# Patient Record
Sex: Male | Born: 1965 | Race: White | Hispanic: No | Marital: Married | Smoking: Never smoker
Health system: Southern US, Community
[De-identification: ages and names within clinical notes are randomized; demographics above are authoritative.]

## PROBLEM LIST (undated history)

## (undated) DIAGNOSIS — R002 Palpitations: Secondary | ICD-10-CM

## (undated) DIAGNOSIS — L4050 Arthropathic psoriasis, unspecified: Secondary | ICD-10-CM

## (undated) DIAGNOSIS — I10 Essential (primary) hypertension: Secondary | ICD-10-CM

## (undated) DIAGNOSIS — J309 Allergic rhinitis, unspecified: Secondary | ICD-10-CM

## (undated) DIAGNOSIS — E785 Hyperlipidemia, unspecified: Secondary | ICD-10-CM

## (undated) HISTORY — DX: Essential (primary) hypertension: I10

## (undated) HISTORY — DX: Hyperlipidemia, unspecified: E78.5

## (undated) HISTORY — DX: Palpitations: R00.2

## (undated) HISTORY — PX: OTHER SURGICAL HISTORY: SHX169

## (undated) HISTORY — DX: Allergic rhinitis, unspecified: J30.9

## (undated) HISTORY — DX: Arthropathic psoriasis, unspecified: L40.50

---

## 2016-07-29 DIAGNOSIS — M79672 Pain in left foot: Secondary | ICD-10-CM | POA: Diagnosis not present

## 2016-07-29 DIAGNOSIS — Z79899 Other long term (current) drug therapy: Secondary | ICD-10-CM | POA: Diagnosis not present

## 2016-07-29 DIAGNOSIS — M79671 Pain in right foot: Secondary | ICD-10-CM | POA: Diagnosis not present

## 2016-07-29 DIAGNOSIS — L4050 Arthropathic psoriasis, unspecified: Secondary | ICD-10-CM | POA: Diagnosis not present

## 2016-08-01 DIAGNOSIS — M79671 Pain in right foot: Secondary | ICD-10-CM | POA: Diagnosis not present

## 2016-08-01 DIAGNOSIS — L4050 Arthropathic psoriasis, unspecified: Secondary | ICD-10-CM | POA: Diagnosis not present

## 2016-08-01 DIAGNOSIS — Z79899 Other long term (current) drug therapy: Secondary | ICD-10-CM | POA: Diagnosis not present

## 2016-08-01 DIAGNOSIS — M79672 Pain in left foot: Secondary | ICD-10-CM | POA: Diagnosis not present

## 2016-08-25 DIAGNOSIS — L4050 Arthropathic psoriasis, unspecified: Secondary | ICD-10-CM | POA: Diagnosis not present

## 2016-08-25 DIAGNOSIS — M79672 Pain in left foot: Secondary | ICD-10-CM | POA: Diagnosis not present

## 2016-08-25 DIAGNOSIS — Z79899 Other long term (current) drug therapy: Secondary | ICD-10-CM | POA: Diagnosis not present

## 2016-08-25 DIAGNOSIS — M79671 Pain in right foot: Secondary | ICD-10-CM | POA: Diagnosis not present

## 2016-09-17 DIAGNOSIS — M79671 Pain in right foot: Secondary | ICD-10-CM | POA: Diagnosis not present

## 2016-09-17 DIAGNOSIS — Z79899 Other long term (current) drug therapy: Secondary | ICD-10-CM | POA: Diagnosis not present

## 2016-09-17 DIAGNOSIS — L4050 Arthropathic psoriasis, unspecified: Secondary | ICD-10-CM | POA: Diagnosis not present

## 2016-09-17 DIAGNOSIS — M79672 Pain in left foot: Secondary | ICD-10-CM | POA: Diagnosis not present

## 2016-12-16 DIAGNOSIS — M79672 Pain in left foot: Secondary | ICD-10-CM | POA: Diagnosis not present

## 2016-12-16 DIAGNOSIS — M79674 Pain in right toe(s): Secondary | ICD-10-CM | POA: Diagnosis not present

## 2016-12-16 DIAGNOSIS — Z79899 Other long term (current) drug therapy: Secondary | ICD-10-CM | POA: Diagnosis not present

## 2016-12-16 DIAGNOSIS — L4050 Arthropathic psoriasis, unspecified: Secondary | ICD-10-CM | POA: Diagnosis not present

## 2016-12-16 DIAGNOSIS — M79671 Pain in right foot: Secondary | ICD-10-CM | POA: Diagnosis not present

## 2016-12-31 DIAGNOSIS — L409 Psoriasis, unspecified: Secondary | ICD-10-CM | POA: Diagnosis not present

## 2016-12-31 DIAGNOSIS — L570 Actinic keratosis: Secondary | ICD-10-CM | POA: Diagnosis not present

## 2016-12-31 DIAGNOSIS — L578 Other skin changes due to chronic exposure to nonionizing radiation: Secondary | ICD-10-CM | POA: Diagnosis not present

## 2016-12-31 DIAGNOSIS — L814 Other melanin hyperpigmentation: Secondary | ICD-10-CM | POA: Diagnosis not present

## 2016-12-31 DIAGNOSIS — L821 Other seborrheic keratosis: Secondary | ICD-10-CM | POA: Diagnosis not present

## 2017-02-20 DIAGNOSIS — L820 Inflamed seborrheic keratosis: Secondary | ICD-10-CM | POA: Diagnosis not present

## 2017-05-14 DIAGNOSIS — Z79899 Other long term (current) drug therapy: Secondary | ICD-10-CM | POA: Diagnosis not present

## 2017-05-14 DIAGNOSIS — L4050 Arthropathic psoriasis, unspecified: Secondary | ICD-10-CM | POA: Diagnosis not present

## 2017-05-14 DIAGNOSIS — L409 Psoriasis, unspecified: Secondary | ICD-10-CM | POA: Diagnosis not present

## 2017-06-15 DIAGNOSIS — L409 Psoriasis, unspecified: Secondary | ICD-10-CM | POA: Diagnosis not present

## 2017-06-15 DIAGNOSIS — Z79899 Other long term (current) drug therapy: Secondary | ICD-10-CM | POA: Diagnosis not present

## 2017-06-15 DIAGNOSIS — M7989 Other specified soft tissue disorders: Secondary | ICD-10-CM | POA: Diagnosis not present

## 2017-06-15 DIAGNOSIS — L4050 Arthropathic psoriasis, unspecified: Secondary | ICD-10-CM | POA: Diagnosis not present

## 2017-08-10 DIAGNOSIS — M7989 Other specified soft tissue disorders: Secondary | ICD-10-CM | POA: Diagnosis not present

## 2017-08-10 DIAGNOSIS — L4050 Arthropathic psoriasis, unspecified: Secondary | ICD-10-CM | POA: Diagnosis not present

## 2017-08-10 DIAGNOSIS — Z79899 Other long term (current) drug therapy: Secondary | ICD-10-CM | POA: Diagnosis not present

## 2017-08-10 DIAGNOSIS — L409 Psoriasis, unspecified: Secondary | ICD-10-CM | POA: Diagnosis not present

## 2017-09-30 DIAGNOSIS — M7989 Other specified soft tissue disorders: Secondary | ICD-10-CM | POA: Diagnosis not present

## 2017-09-30 DIAGNOSIS — L4050 Arthropathic psoriasis, unspecified: Secondary | ICD-10-CM | POA: Diagnosis not present

## 2017-09-30 DIAGNOSIS — L409 Psoriasis, unspecified: Secondary | ICD-10-CM | POA: Diagnosis not present

## 2017-09-30 DIAGNOSIS — Z79899 Other long term (current) drug therapy: Secondary | ICD-10-CM | POA: Diagnosis not present

## 2017-10-05 DIAGNOSIS — Z79899 Other long term (current) drug therapy: Secondary | ICD-10-CM | POA: Diagnosis not present

## 2017-10-05 DIAGNOSIS — L4050 Arthropathic psoriasis, unspecified: Secondary | ICD-10-CM | POA: Diagnosis not present

## 2017-10-12 DIAGNOSIS — Z79899 Other long term (current) drug therapy: Secondary | ICD-10-CM | POA: Diagnosis not present

## 2017-10-12 DIAGNOSIS — M79675 Pain in left toe(s): Secondary | ICD-10-CM | POA: Diagnosis not present

## 2017-10-12 DIAGNOSIS — L4050 Arthropathic psoriasis, unspecified: Secondary | ICD-10-CM | POA: Diagnosis not present

## 2017-10-12 DIAGNOSIS — L409 Psoriasis, unspecified: Secondary | ICD-10-CM | POA: Diagnosis not present

## 2017-10-12 DIAGNOSIS — M7989 Other specified soft tissue disorders: Secondary | ICD-10-CM | POA: Diagnosis not present

## 2017-11-02 DIAGNOSIS — Z125 Encounter for screening for malignant neoplasm of prostate: Secondary | ICD-10-CM | POA: Diagnosis not present

## 2017-11-02 DIAGNOSIS — Z136 Encounter for screening for cardiovascular disorders: Secondary | ICD-10-CM | POA: Diagnosis not present

## 2017-11-02 DIAGNOSIS — Z0000 Encounter for general adult medical examination without abnormal findings: Secondary | ICD-10-CM | POA: Diagnosis not present

## 2017-12-30 DIAGNOSIS — L4050 Arthropathic psoriasis, unspecified: Secondary | ICD-10-CM | POA: Diagnosis not present

## 2017-12-30 DIAGNOSIS — M549 Dorsalgia, unspecified: Secondary | ICD-10-CM | POA: Diagnosis not present

## 2017-12-30 DIAGNOSIS — M5136 Other intervertebral disc degeneration, lumbar region: Secondary | ICD-10-CM | POA: Diagnosis not present

## 2017-12-30 DIAGNOSIS — M545 Low back pain: Secondary | ICD-10-CM | POA: Diagnosis not present

## 2017-12-30 DIAGNOSIS — Z79899 Other long term (current) drug therapy: Secondary | ICD-10-CM | POA: Diagnosis not present

## 2017-12-30 DIAGNOSIS — M7989 Other specified soft tissue disorders: Secondary | ICD-10-CM | POA: Diagnosis not present

## 2018-01-08 DIAGNOSIS — M5441 Lumbago with sciatica, right side: Secondary | ICD-10-CM | POA: Diagnosis not present

## 2018-01-08 DIAGNOSIS — Z6828 Body mass index (BMI) 28.0-28.9, adult: Secondary | ICD-10-CM | POA: Diagnosis not present

## 2018-01-08 DIAGNOSIS — R030 Elevated blood-pressure reading, without diagnosis of hypertension: Secondary | ICD-10-CM | POA: Diagnosis not present

## 2018-01-19 ENCOUNTER — Ambulatory Visit: Payer: BLUE CROSS/BLUE SHIELD | Attending: Neurosurgery | Admitting: Physical Therapy

## 2018-01-19 ENCOUNTER — Encounter: Payer: Self-pay | Admitting: Physical Therapy

## 2018-01-19 DIAGNOSIS — M5441 Lumbago with sciatica, right side: Secondary | ICD-10-CM | POA: Insufficient documentation

## 2018-01-19 DIAGNOSIS — M6281 Muscle weakness (generalized): Secondary | ICD-10-CM | POA: Insufficient documentation

## 2018-01-19 NOTE — Therapy (Addendum)
[facility] [place], [place], [address] Phone: [phone]   Fax:  [phone]  Physical Therapy Evaluation/Discharge  Patient Details  Name: Kevin Walsh MRN: [id number] Date of Birth: 1966-02-06 Referring Provider (PT): Frederich Cha, MD   Encounter Date: 01/19/2018  PT End of Session - 01/19/18 1602    Visit Number  1    Number of Visits  1    Date for PT Re-Evaluation  01/26/18    PT Start Time  4008    PT Stop Time  1600    PT Time Calculation (min)  45 min    Activity Tolerance  Patient tolerated treatment well    Behavior During Therapy  [facility] for tasks assessed/performed       Past Medical History:  Diagnosis Date  . Allergic rhinitis, cause unspecified   . Palpitations     History reviewed. No pertinent surgical history.  There were no vitals filed for this visit.   Subjective Assessment - 01/19/18 2200    Subjective  Patient arrives to physical therapy with reports of acute low back pain after trimming the hedges and playing golf. Patient reported discomfort while reaching forward trimming a hedge and after a  golf swing but continued to play. Patient stated the following day he began to feel numbness, tingling, and burning sensation from low back down right mid thigh. Patient reports he was prescribed anti-inflammatories and muscle relaxers that helped with pain and neurological symptoms but still reported muscle weakness in R leg. Patient's pain at worst is 2/10; pain at best is 0/10. Patient's goals are to return to PLOF and return to playing golf with no pain    Limitations  Standing;Walking    Patient Stated Goals  x-Ray    Currently in Pain?  Yes    Pain Score  2     Pain Location  Back    Pain Orientation  Lower;Right    Pain Descriptors / Indicators  Aching;Sharp    Pain Type  Acute pain    Pain Onset  1 to 4 weeks ago    Aggravating Factors   n/a    Pain Relieving Factors  exercise          [facility] PT Assessment - 01/19/18 0001      Assessment   Medical Diagnosis  Acute low back pain with right sided scatica    Referring Provider (PT)  Frederich Cha, MD    Onset Date/Surgical Date  12/26/17    Next MD Visit  December, 2019    Prior Therapy  no      Precautions   Precautions  None      Restrictions   Weight Bearing Restrictions  No      Balance Screen   Has the patient fallen in the past 6 months  No    Has the patient had a decrease in activity level because of a fear of falling?   No    Is the patient reluctant to leave their home because of a fear of falling?   No      Home Film/video editor residence      Prior Function   Level of Independence  Independent      ROM / Strength   AROM / PROM / Strength  Strength      Strength   Strength Assessment Site  Hip;Knee    Right/Left Hip  Right;Left    Right Hip  Flexion  4-/5    Right Hip Extension  3/5    Right Hip ABduction  4-/5    Left Hip Flexion  4+/5    Left Hip Extension  4/5    Left Hip ABduction  4-/5    Left Hip ADduction  4/5    Right/Left Knee  Right;Left    Right Knee Flexion  4/5    Right Knee Extension  4/5    Left Knee Flexion  4+/5    Left Knee Extension  4+/5      Palpation   SI assessment   unequal leg lengths R>L; equalized after SIJ LEg length MET    Palpation comment  minimal tenderness to palpation to lumbar paraspinals.      Transfers   Transfers  Independent with all Transfers      Ambulation/Gait   Gait Pattern  Within Functional Limits                Objective measurements completed on examination: See above findings.              PT Education - 01/19/18 2211    Education Details  draw ins, SLR, bridges, piriformis stretch, LTR stretch, SIJ MET, sit to stand, scatic nerve glide, lateral step ups    Methods  Explanation;Demonstration;Handout    Comprehension  Verbalized understanding;Returned demonstration           PT Long Term Goals - 01/19/18 2214      PT LONG TERM GOAL #1   Title  Patient will be independent with HEP    Time  1    Period  Days    Status  New             Plan - 01/19/18 2215    Clinical Impression Statement  Patient is a 52 year old male who presents to physical therapy with right LE weakness. Patient noted with minimal tenderness to palpation along R SIJ and right QL. Patient noted with unequal leg lengths, R>L. Per patient request, evaluation only performed. Patient was provided with HEP and was reviewed consisting of core stabilization exercises as well as LE strengthening. Patient was able to demonstrate good form with all exercises. Patient educated importance of core stabilization for low back pain as well as provided with basic anatomy of spine and sacroiliac joint. Patient instructed to call if any questions arise. Patient reported understanding. Patient to be discharged today.    Clinical Presentation  Stable    Clinical Decision Making  Low    Rehab Potential  Excellent    PT Frequency  One time visit    PT Treatment/Interventions  Patient/family education;ADLs/Self Care Home Management;Therapeutic exercise    PT Next Visit Plan  EVAL ONLY    PT Home Exercise Plan  see patient education section    Consulted and Agree with Plan of Care  Patient       Patient will benefit from skilled therapeutic intervention in order to improve the following deficits and impairments:  Pain, Postural dysfunction, Decreased strength, Decreased range of motion  Visit Diagnosis: Acute right-sided low back pain with right-sided sciatica - Plan: PT plan of care cert/re-cert  Muscle weakness (generalized) - Plan: PT plan of care cert/re-cert     Problem List There are no active problems to display for this patient.   PHYSICAL THERAPY DISCHARGE SUMMARY  Visits from Start of Care: 1   Current functional level related to goals / functional outcomes: Evaluation  only  Remaining deficits: Goal met   Education / Equipment: HEP  Plan: Patient agrees to discharge.  Patient goals were not met. Patient is being discharged due to the patient's request.  ?????      Gabriela Eves, PT, DPT 01/19/2018, 10:18 PM  [facility] [place], [place], [address] Phone: [phone]   Fax:  [phone]  Name: MEDARD DECUIR MRN: [id number] Date of Birth: February 27, 1966

## 2018-02-11 DIAGNOSIS — Z1211 Encounter for screening for malignant neoplasm of colon: Secondary | ICD-10-CM | POA: Diagnosis not present

## 2018-02-11 DIAGNOSIS — K640 First degree hemorrhoids: Secondary | ICD-10-CM | POA: Diagnosis not present

## 2018-02-25 DIAGNOSIS — L409 Psoriasis, unspecified: Secondary | ICD-10-CM | POA: Diagnosis not present

## 2018-02-25 DIAGNOSIS — I781 Nevus, non-neoplastic: Secondary | ICD-10-CM | POA: Diagnosis not present

## 2018-02-25 DIAGNOSIS — L820 Inflamed seborrheic keratosis: Secondary | ICD-10-CM | POA: Diagnosis not present

## 2018-02-25 DIAGNOSIS — L821 Other seborrheic keratosis: Secondary | ICD-10-CM | POA: Diagnosis not present

## 2018-02-25 DIAGNOSIS — L570 Actinic keratosis: Secondary | ICD-10-CM | POA: Diagnosis not present

## 2018-02-25 DIAGNOSIS — L4050 Arthropathic psoriasis, unspecified: Secondary | ICD-10-CM | POA: Diagnosis not present

## 2018-02-25 DIAGNOSIS — D1801 Hemangioma of skin and subcutaneous tissue: Secondary | ICD-10-CM | POA: Diagnosis not present

## 2018-03-10 DIAGNOSIS — I10 Essential (primary) hypertension: Secondary | ICD-10-CM | POA: Diagnosis not present

## 2018-03-26 ENCOUNTER — Other Ambulatory Visit: Payer: Self-pay | Admitting: Geriatric Medicine

## 2018-03-26 ENCOUNTER — Ambulatory Visit
Admission: RE | Admit: 2018-03-26 | Discharge: 2018-03-26 | Disposition: A | Discharge status: Home / Self Care | Payer: BLUE CROSS/BLUE SHIELD | Source: Ambulatory Visit | Attending: Geriatric Medicine | Admitting: Geriatric Medicine

## 2018-03-26 DIAGNOSIS — R05 Cough: Secondary | ICD-10-CM

## 2018-03-26 DIAGNOSIS — I10 Essential (primary) hypertension: Secondary | ICD-10-CM | POA: Diagnosis not present

## 2018-03-26 DIAGNOSIS — R509 Fever, unspecified: Secondary | ICD-10-CM | POA: Diagnosis not present

## 2018-03-26 DIAGNOSIS — R059 Cough, unspecified: Secondary | ICD-10-CM

## 2018-03-26 DIAGNOSIS — Z79899 Other long term (current) drug therapy: Secondary | ICD-10-CM | POA: Diagnosis not present

## 2018-04-05 DIAGNOSIS — M7989 Other specified soft tissue disorders: Secondary | ICD-10-CM | POA: Diagnosis not present

## 2018-04-05 DIAGNOSIS — Z79899 Other long term (current) drug therapy: Secondary | ICD-10-CM | POA: Diagnosis not present

## 2018-04-05 DIAGNOSIS — M549 Dorsalgia, unspecified: Secondary | ICD-10-CM | POA: Diagnosis not present

## 2018-04-05 DIAGNOSIS — L4050 Arthropathic psoriasis, unspecified: Secondary | ICD-10-CM | POA: Diagnosis not present

## 2018-04-05 DIAGNOSIS — M79676 Pain in unspecified toe(s): Secondary | ICD-10-CM | POA: Diagnosis not present

## 2018-04-07 DIAGNOSIS — I10 Essential (primary) hypertension: Secondary | ICD-10-CM | POA: Diagnosis not present

## 2018-09-20 DIAGNOSIS — M549 Dorsalgia, unspecified: Secondary | ICD-10-CM | POA: Diagnosis not present

## 2018-09-20 DIAGNOSIS — L4050 Arthropathic psoriasis, unspecified: Secondary | ICD-10-CM | POA: Diagnosis not present

## 2018-09-20 DIAGNOSIS — Z791 Long term (current) use of non-steroidal anti-inflammatories (NSAID): Secondary | ICD-10-CM | POA: Diagnosis not present

## 2018-09-20 DIAGNOSIS — Z79899 Other long term (current) drug therapy: Secondary | ICD-10-CM | POA: Diagnosis not present

## 2018-11-22 DIAGNOSIS — L814 Other melanin hyperpigmentation: Secondary | ICD-10-CM | POA: Diagnosis not present

## 2018-11-22 DIAGNOSIS — L578 Other skin changes due to chronic exposure to nonionizing radiation: Secondary | ICD-10-CM | POA: Diagnosis not present

## 2018-11-22 DIAGNOSIS — L821 Other seborrheic keratosis: Secondary | ICD-10-CM | POA: Diagnosis not present

## 2018-11-22 DIAGNOSIS — L409 Psoriasis, unspecified: Secondary | ICD-10-CM | POA: Diagnosis not present

## 2018-11-22 DIAGNOSIS — L570 Actinic keratosis: Secondary | ICD-10-CM | POA: Diagnosis not present

## 2018-12-22 DIAGNOSIS — Z791 Long term (current) use of non-steroidal anti-inflammatories (NSAID): Secondary | ICD-10-CM | POA: Diagnosis not present

## 2018-12-22 DIAGNOSIS — L4050 Arthropathic psoriasis, unspecified: Secondary | ICD-10-CM | POA: Diagnosis not present

## 2018-12-22 DIAGNOSIS — Z79899 Other long term (current) drug therapy: Secondary | ICD-10-CM | POA: Diagnosis not present

## 2018-12-22 DIAGNOSIS — Z23 Encounter for immunization: Secondary | ICD-10-CM | POA: Diagnosis not present

## 2018-12-22 DIAGNOSIS — M79671 Pain in right foot: Secondary | ICD-10-CM | POA: Diagnosis not present

## 2019-02-02 DIAGNOSIS — Z791 Long term (current) use of non-steroidal anti-inflammatories (NSAID): Secondary | ICD-10-CM | POA: Diagnosis not present

## 2019-02-02 DIAGNOSIS — M79672 Pain in left foot: Secondary | ICD-10-CM | POA: Diagnosis not present

## 2019-02-02 DIAGNOSIS — L4050 Arthropathic psoriasis, unspecified: Secondary | ICD-10-CM | POA: Diagnosis not present

## 2019-02-02 DIAGNOSIS — Z79899 Other long term (current) drug therapy: Secondary | ICD-10-CM | POA: Diagnosis not present

## 2019-03-22 DIAGNOSIS — Z23 Encounter for immunization: Secondary | ICD-10-CM | POA: Diagnosis not present

## 2019-03-22 DIAGNOSIS — Z125 Encounter for screening for malignant neoplasm of prostate: Secondary | ICD-10-CM | POA: Diagnosis not present

## 2019-03-22 DIAGNOSIS — Z79899 Other long term (current) drug therapy: Secondary | ICD-10-CM | POA: Diagnosis not present

## 2019-03-22 DIAGNOSIS — Z0000 Encounter for general adult medical examination without abnormal findings: Secondary | ICD-10-CM | POA: Diagnosis not present

## 2019-03-22 DIAGNOSIS — I10 Essential (primary) hypertension: Secondary | ICD-10-CM | POA: Diagnosis not present

## 2019-05-09 DIAGNOSIS — Z79899 Other long term (current) drug therapy: Secondary | ICD-10-CM | POA: Diagnosis not present

## 2019-05-09 DIAGNOSIS — Z791 Long term (current) use of non-steroidal anti-inflammatories (NSAID): Secondary | ICD-10-CM | POA: Diagnosis not present

## 2019-05-09 DIAGNOSIS — L4050 Arthropathic psoriasis, unspecified: Secondary | ICD-10-CM | POA: Diagnosis not present

## 2019-05-09 DIAGNOSIS — L409 Psoriasis, unspecified: Secondary | ICD-10-CM | POA: Diagnosis not present

## 2019-06-17 DIAGNOSIS — M19071 Primary osteoarthritis, right ankle and foot: Secondary | ICD-10-CM | POA: Diagnosis not present

## 2019-06-17 DIAGNOSIS — L4059 Other psoriatic arthropathy: Secondary | ICD-10-CM | POA: Diagnosis not present

## 2019-06-17 DIAGNOSIS — Z79899 Other long term (current) drug therapy: Secondary | ICD-10-CM | POA: Diagnosis not present

## 2019-06-17 DIAGNOSIS — M19072 Primary osteoarthritis, left ankle and foot: Secondary | ICD-10-CM | POA: Diagnosis not present

## 2019-06-17 DIAGNOSIS — L4050 Arthropathic psoriasis, unspecified: Secondary | ICD-10-CM | POA: Diagnosis not present

## 2019-06-17 DIAGNOSIS — M79671 Pain in right foot: Secondary | ICD-10-CM | POA: Diagnosis not present

## 2019-06-17 DIAGNOSIS — L089 Local infection of the skin and subcutaneous tissue, unspecified: Secondary | ICD-10-CM | POA: Diagnosis not present

## 2019-06-17 DIAGNOSIS — M79672 Pain in left foot: Secondary | ICD-10-CM | POA: Diagnosis not present

## 2019-06-17 DIAGNOSIS — Z791 Long term (current) use of non-steroidal anti-inflammatories (NSAID): Secondary | ICD-10-CM | POA: Diagnosis not present

## 2019-08-15 DIAGNOSIS — Z23 Encounter for immunization: Secondary | ICD-10-CM | POA: Diagnosis not present

## 2019-08-15 DIAGNOSIS — L409 Psoriasis, unspecified: Secondary | ICD-10-CM | POA: Diagnosis not present

## 2019-08-15 DIAGNOSIS — L4050 Arthropathic psoriasis, unspecified: Secondary | ICD-10-CM | POA: Diagnosis not present

## 2019-08-15 DIAGNOSIS — Z79899 Other long term (current) drug therapy: Secondary | ICD-10-CM | POA: Diagnosis not present

## 2019-08-15 DIAGNOSIS — Z791 Long term (current) use of non-steroidal anti-inflammatories (NSAID): Secondary | ICD-10-CM | POA: Diagnosis not present

## 2019-08-15 DIAGNOSIS — E7800 Pure hypercholesterolemia, unspecified: Secondary | ICD-10-CM | POA: Diagnosis not present

## 2019-09-15 DIAGNOSIS — L719 Rosacea, unspecified: Secondary | ICD-10-CM | POA: Diagnosis not present

## 2019-10-04 DIAGNOSIS — L4050 Arthropathic psoriasis, unspecified: Secondary | ICD-10-CM | POA: Diagnosis not present

## 2019-10-04 DIAGNOSIS — L409 Psoriasis, unspecified: Secondary | ICD-10-CM | POA: Diagnosis not present

## 2019-10-04 DIAGNOSIS — M7989 Other specified soft tissue disorders: Secondary | ICD-10-CM | POA: Diagnosis not present

## 2019-10-04 DIAGNOSIS — Z79899 Other long term (current) drug therapy: Secondary | ICD-10-CM | POA: Diagnosis not present

## 2019-10-04 DIAGNOSIS — Z791 Long term (current) use of non-steroidal anti-inflammatories (NSAID): Secondary | ICD-10-CM | POA: Diagnosis not present

## 2019-10-18 DIAGNOSIS — I10 Essential (primary) hypertension: Secondary | ICD-10-CM | POA: Diagnosis not present

## 2019-10-18 DIAGNOSIS — Z79899 Other long term (current) drug therapy: Secondary | ICD-10-CM | POA: Diagnosis not present

## 2020-02-10 DIAGNOSIS — L408 Other psoriasis: Secondary | ICD-10-CM | POA: Diagnosis not present

## 2020-02-10 DIAGNOSIS — D229 Melanocytic nevi, unspecified: Secondary | ICD-10-CM | POA: Diagnosis not present

## 2020-02-10 DIAGNOSIS — L821 Other seborrheic keratosis: Secondary | ICD-10-CM | POA: Diagnosis not present

## 2020-02-10 DIAGNOSIS — L409 Psoriasis, unspecified: Secondary | ICD-10-CM | POA: Diagnosis not present

## 2020-02-10 DIAGNOSIS — L570 Actinic keratosis: Secondary | ICD-10-CM | POA: Diagnosis not present

## 2020-02-10 DIAGNOSIS — L719 Rosacea, unspecified: Secondary | ICD-10-CM | POA: Diagnosis not present

## 2020-03-23 IMAGING — DX DG CHEST 2V
2 series · 2 of 2 positions shown · non-contrast
Comparison: None.

CLINICAL DATA: Cough and low-grade fever for 1 week.

EXAM:
CHEST - 2 VIEW

[dg chest 2 view (1 of 2)]
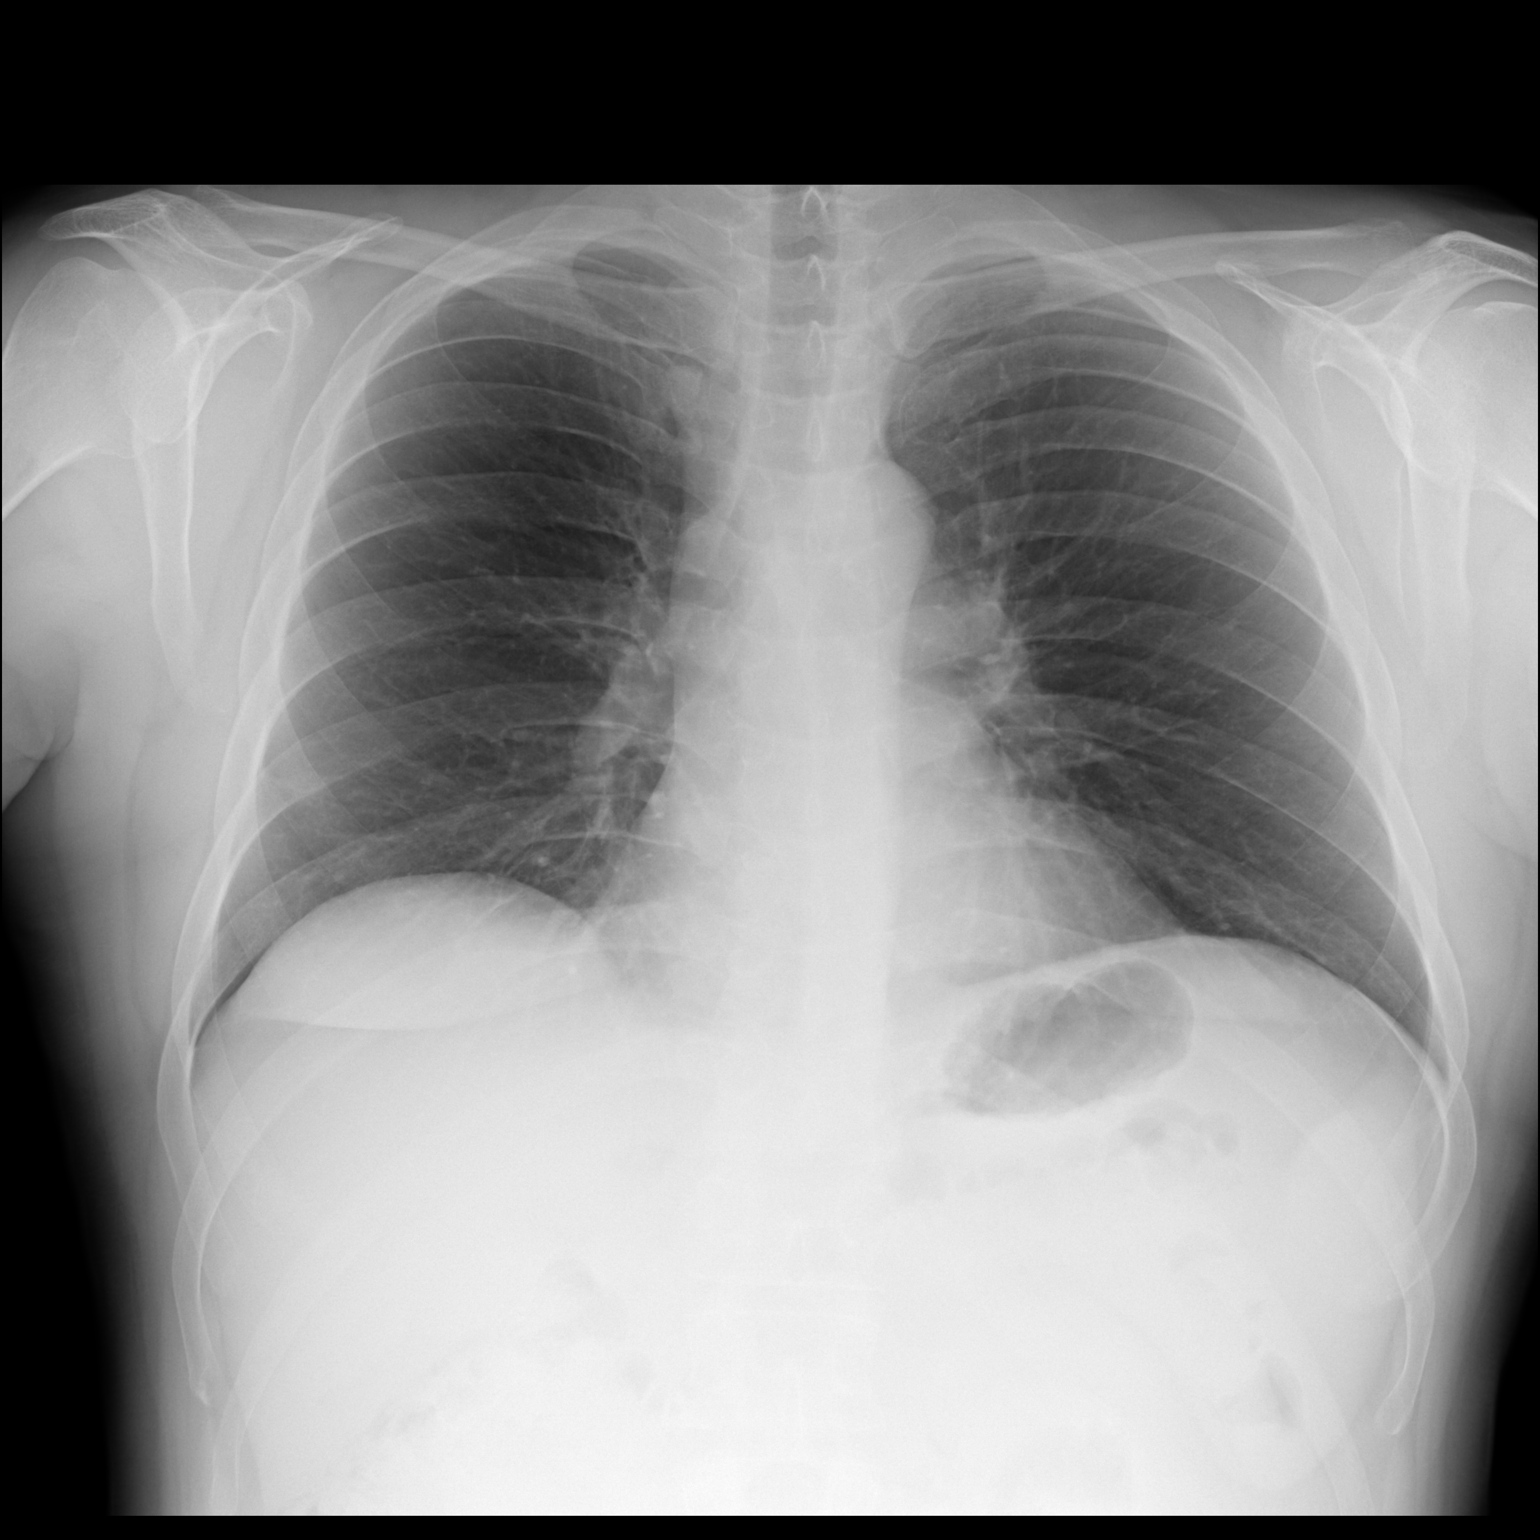

[dg chest 2 view (2 of 2)]
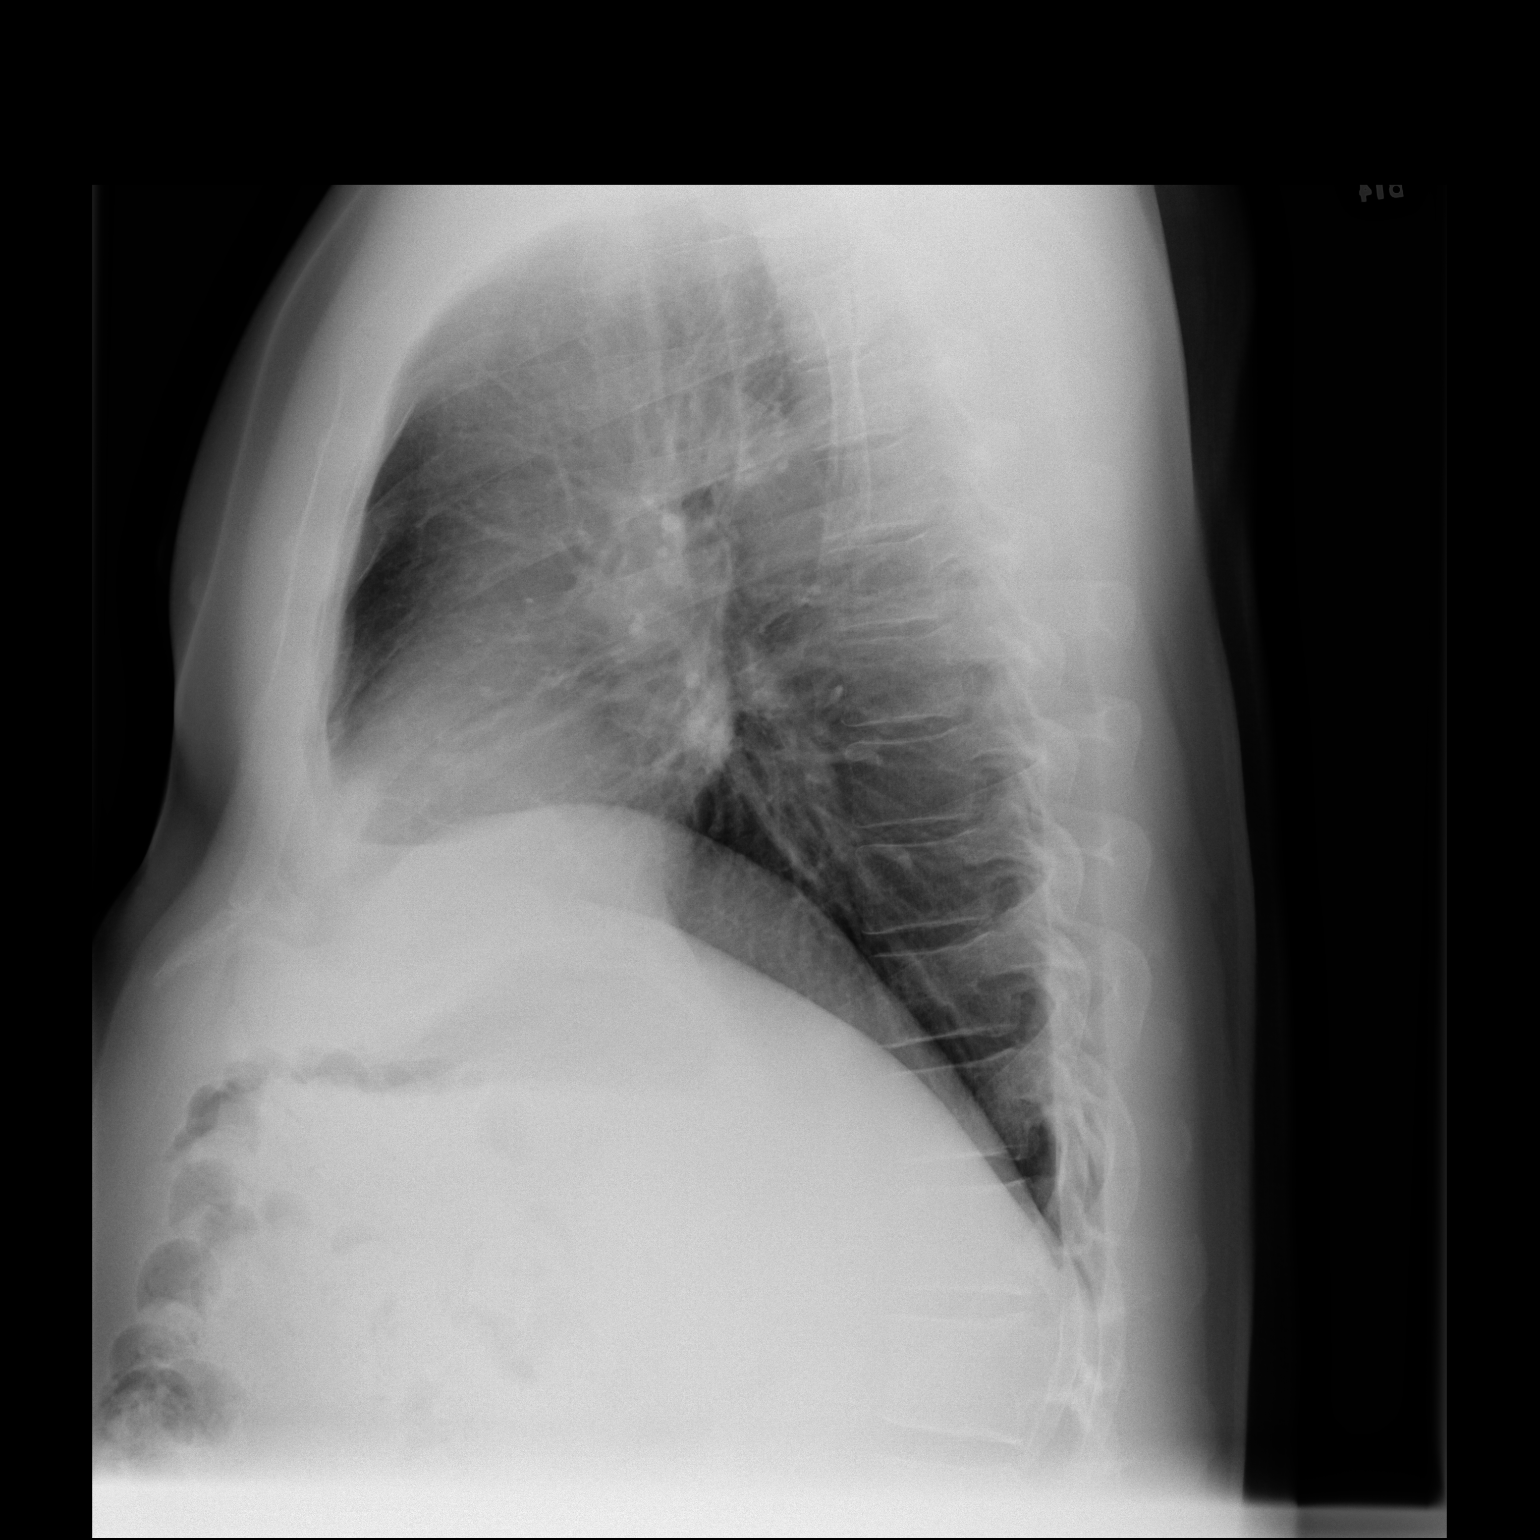

[2 of 2 positions shown; findings below may reference images not displayed]

FINDINGS: Cardiomediastinal silhouette is normal. Mediastinal contours appear
intact.

There is no evidence of focal airspace consolidation, pleural
effusion or pneumothorax.

Osseous structures are without acute abnormality. Soft tissues are
grossly normal.
IMPRESSION: No active cardiopulmonary disease.

## 2020-04-04 DIAGNOSIS — Z79899 Other long term (current) drug therapy: Secondary | ICD-10-CM | POA: Diagnosis not present

## 2020-04-04 DIAGNOSIS — L4050 Arthropathic psoriasis, unspecified: Secondary | ICD-10-CM | POA: Diagnosis not present

## 2020-04-04 DIAGNOSIS — Z791 Long term (current) use of non-steroidal anti-inflammatories (NSAID): Secondary | ICD-10-CM | POA: Diagnosis not present

## 2020-04-04 DIAGNOSIS — L409 Psoriasis, unspecified: Secondary | ICD-10-CM | POA: Diagnosis not present

## 2020-04-10 DIAGNOSIS — R109 Unspecified abdominal pain: Secondary | ICD-10-CM | POA: Diagnosis not present

## 2020-04-10 DIAGNOSIS — R1030 Lower abdominal pain, unspecified: Secondary | ICD-10-CM | POA: Diagnosis not present

## 2020-06-21 DIAGNOSIS — L4050 Arthropathic psoriasis, unspecified: Secondary | ICD-10-CM | POA: Diagnosis not present

## 2020-06-21 DIAGNOSIS — I10 Essential (primary) hypertension: Secondary | ICD-10-CM | POA: Diagnosis not present

## 2020-06-21 DIAGNOSIS — K120 Recurrent oral aphthae: Secondary | ICD-10-CM | POA: Diagnosis not present

## 2020-07-04 DIAGNOSIS — Z79899 Other long term (current) drug therapy: Secondary | ICD-10-CM | POA: Diagnosis not present

## 2020-07-04 DIAGNOSIS — Z791 Long term (current) use of non-steroidal anti-inflammatories (NSAID): Secondary | ICD-10-CM | POA: Diagnosis not present

## 2020-07-04 DIAGNOSIS — M7989 Other specified soft tissue disorders: Secondary | ICD-10-CM | POA: Diagnosis not present

## 2020-07-04 DIAGNOSIS — L4050 Arthropathic psoriasis, unspecified: Secondary | ICD-10-CM | POA: Diagnosis not present

## 2020-07-04 DIAGNOSIS — L409 Psoriasis, unspecified: Secondary | ICD-10-CM | POA: Diagnosis not present

## 2020-08-15 DIAGNOSIS — M79641 Pain in right hand: Secondary | ICD-10-CM | POA: Diagnosis not present

## 2020-08-15 DIAGNOSIS — Z791 Long term (current) use of non-steroidal anti-inflammatories (NSAID): Secondary | ICD-10-CM | POA: Diagnosis not present

## 2020-08-15 DIAGNOSIS — L4050 Arthropathic psoriasis, unspecified: Secondary | ICD-10-CM | POA: Diagnosis not present

## 2020-08-15 DIAGNOSIS — L409 Psoriasis, unspecified: Secondary | ICD-10-CM | POA: Diagnosis not present

## 2020-08-15 DIAGNOSIS — M79642 Pain in left hand: Secondary | ICD-10-CM | POA: Diagnosis not present

## 2020-08-15 DIAGNOSIS — M19042 Primary osteoarthritis, left hand: Secondary | ICD-10-CM | POA: Diagnosis not present

## 2020-08-15 DIAGNOSIS — Z79899 Other long term (current) drug therapy: Secondary | ICD-10-CM | POA: Diagnosis not present

## 2020-08-15 DIAGNOSIS — M7989 Other specified soft tissue disorders: Secondary | ICD-10-CM | POA: Diagnosis not present

## 2020-08-15 DIAGNOSIS — M19041 Primary osteoarthritis, right hand: Secondary | ICD-10-CM | POA: Diagnosis not present

## 2020-10-08 DIAGNOSIS — Z79899 Other long term (current) drug therapy: Secondary | ICD-10-CM | POA: Diagnosis not present

## 2020-10-08 DIAGNOSIS — Z791 Long term (current) use of non-steroidal anti-inflammatories (NSAID): Secondary | ICD-10-CM | POA: Diagnosis not present

## 2020-10-08 DIAGNOSIS — L409 Psoriasis, unspecified: Secondary | ICD-10-CM | POA: Diagnosis not present

## 2020-10-08 DIAGNOSIS — M7989 Other specified soft tissue disorders: Secondary | ICD-10-CM | POA: Diagnosis not present

## 2020-10-08 DIAGNOSIS — L4050 Arthropathic psoriasis, unspecified: Secondary | ICD-10-CM | POA: Diagnosis not present

## 2020-10-24 DIAGNOSIS — M79672 Pain in left foot: Secondary | ICD-10-CM | POA: Diagnosis not present

## 2020-10-24 DIAGNOSIS — Z79899 Other long term (current) drug therapy: Secondary | ICD-10-CM | POA: Diagnosis not present

## 2020-10-24 DIAGNOSIS — L4050 Arthropathic psoriasis, unspecified: Secondary | ICD-10-CM | POA: Diagnosis not present

## 2020-10-24 DIAGNOSIS — L089 Local infection of the skin and subcutaneous tissue, unspecified: Secondary | ICD-10-CM | POA: Diagnosis not present

## 2020-12-10 DIAGNOSIS — I10 Essential (primary) hypertension: Secondary | ICD-10-CM | POA: Diagnosis not present

## 2020-12-10 DIAGNOSIS — R0789 Other chest pain: Secondary | ICD-10-CM | POA: Diagnosis not present

## 2020-12-10 DIAGNOSIS — E7800 Pure hypercholesterolemia, unspecified: Secondary | ICD-10-CM | POA: Diagnosis not present

## 2020-12-13 DIAGNOSIS — R002 Palpitations: Secondary | ICD-10-CM | POA: Insufficient documentation

## 2020-12-13 DIAGNOSIS — R072 Precordial pain: Secondary | ICD-10-CM | POA: Insufficient documentation

## 2020-12-13 NOTE — Progress Notes (Signed)
Cardiology Office Note   Date:  12/14/2020   ID:  Crit, Obremski 12/06/1965, MRN [id number]  PCP:  Merlene Laughter, MD  Cardiologist:   None Referring:  Merlene Laughter, MD  Chief Complaint  Patient presents with   Chest Pain       History of Present Illness: Kevin Walsh is a 55 y.o. male who presents for evaluation of chest pain.  He is referred by Merlene Laughter, MD.  He has for the first time about 5 weeks ago.  He was walking golfing so he was up and down.  It was very hot.  He felt a chest pressure.  It was about 6 out of 10 in intensity.  It lasted for about 30 minutes.  He had it again about 14 days ago while doing the exact same activity.  In between he does a lot of walking just on flat ground not exerting himself quite as much.  With this he denies any chest pressure, neck or arm discomfort.  He does not have any shortness of breath, PND or orthopnea.  When he had the discomfort it came and went spontaneously.  There were no associated symptoms.  There is no jaw pain or arm pain.  He is otherwise felt well.  He does have psoriatic arthritis and has some joint problems from this.   Past Medical History:  Diagnosis Date   Allergic rhinitis, cause unspecified    Dyslipidemia    Hypertension    Palpitations    Psoriatic arthritis (HCC)     Past Surgical History:  Procedure Laterality Date   None       Current Outpatient Medications  Medication Sig Dispense Refill   ASPIRIN LOW DOSE 81 MG EC tablet Take 81 mg by mouth daily.     Ixekizumab (TALTZ) 80 MG/ML SOAJ inject 80mg  subcutaneously  every 4 weeks     lisinopril (ZESTRIL) 10 MG tablet 1 tablet     metoprolol tartrate (LOPRESSOR) 100 MG tablet Take 1 tablet (100 mg total) by mouth once for 1 dose. Take 90-120 minutes prior to CT test 1 tablet 0   nitroGLYCERIN (NITROSTAT) 0.4 MG SL tablet Place 0.4 mg under the tongue as directed.     predniSONE (DELTASONE) 5 MG tablet 1 tablet     rosuvastatin  (CRESTOR) 5 MG tablet Take 5 mg by mouth daily.     No current facility-administered medications for this visit.    Allergies:   Patient has no known allergies.    Social History:  The patient  reports that he has never smoked. He has never used smokeless tobacco.   Family History:  The patient's family history includes Congestive Heart Failure in his mother; Hypertension in his mother.    ROS:  Please see the history of present illness.   Otherwise, review of systems are positive for none.   All other systems are reviewed and negative.    PHYSICAL EXAM: VS:  BP 109/74   Pulse (!) 112   Ht 5\' 7"  (1.702 m)   Wt 185 lb 9.6 oz (84.2 kg)   SpO2 97%   BMI 29.07 kg/m  , BMI Body mass index is 29.07 kg/m. GENERAL:  Well appearing HEENT:  Pupils equal round and reactive, fundi not visualized, oral mucosa unremarkable NECK:  No jugular venous di2stention, waveform within normal limits, carotid upstroke brisk and symmetric, no bruits, no thyromegaly LYMPHATICS:  No cervical, inguinal adenopathy LUNGS:  Clear to auscultation  bilaterally BACK:  No CVA tenderness CHEST:  Unremarkable HEART:  PMI not displaced or sustained,S1 and S2 within normal limits, no S3, no S4, no clicks, no rubs, no murmurs ABD:  Flat, positive bowel sounds normal in frequency in pitch, no bruits, no rebound, no guarding, no midline pulsatile mass, no hepatomegaly, no splenomegaly EXT:  2 plus pulses throughout, no edema, no cyanosis no clubbing SKIN:  No rashes no nodules NEURO:  Cranial nerves II through XII grossly intact, motor grossly intact throughout PSYCH:  Cognitively intact, oriented to person place and time    EKG:  EKG is ordered today. The ekg ordered today demonstrates sinus tachycardia, rate 115, axis within normal limits, intervals within normal limits, no acute ST-T wave changes   Recent Labs: No results found for requested labs within last 8760 hours.    Lipid Panel No results found for:  CHOL, TRIG, HDL, CHOLHDL, VLDL, LDLCALC, LDLDIRECT    Wt Readings from Last 3 Encounters:  12/14/20 185 lb 9.6 oz (84.2 kg)      Other studies Reviewed: Additional studies/ records that were reviewed today include: Labs. Review of the above records demonstrates:  Please see elsewhere in the note.     ASSESSMENT AND PLAN:  Chest pain: His chest pain has typical greater than atypical features.  It is new onset and exertional.  The pretest probability of obstructive coronary disease is moderately high.  Coronary imaging is indicated and I will order a coronary CTA.  Of note he has some mildly increased creatinine.  He is to orally hydrate.  He will get beta-blocker per protocol but might need Corlanor as he seems to have a slightly high resting heart rate.  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  DYSLIPDEMIA: LDL was 72 with an HDL of 27.  No change in therapy.   Current medicines are reviewed at length with the patient today.  The patient does not have concerns regarding medicines.  The following changes have been made:  no change  Labs/ tests ordered today include:   Orders Placed This Encounter  Procedures   CT CORONARY MORPH W/CTA COR W/SCORE W/CA W/CM &/OR WO/CM   Basic metabolic panel   EKG 12-Lead      Disposition:   FU with me based on the results of the above.   Signed, Rollene Rotunda, MD  12/14/2020 3:46 PM    [facility]

## 2020-12-14 ENCOUNTER — Ambulatory Visit: Payer: BC Managed Care – PPO | Admitting: Cardiology

## 2020-12-14 ENCOUNTER — Other Ambulatory Visit: Payer: Self-pay

## 2020-12-14 ENCOUNTER — Encounter: Payer: Self-pay | Admitting: Cardiology

## 2020-12-14 VITALS — BP 109/74 | HR 112 | Ht 67.0 in | Wt 185.6 lb

## 2020-12-14 DIAGNOSIS — I208 Other forms of angina pectoris: Secondary | ICD-10-CM

## 2020-12-14 DIAGNOSIS — R002 Palpitations: Secondary | ICD-10-CM | POA: Diagnosis not present

## 2020-12-14 DIAGNOSIS — E785 Hyperlipidemia, unspecified: Secondary | ICD-10-CM

## 2020-12-14 DIAGNOSIS — R072 Precordial pain: Secondary | ICD-10-CM

## 2020-12-14 MED ORDER — METOPROLOL TARTRATE 100 MG PO TABS: 100.0000 mg | ORAL_TABLET | Freq: Once | ORAL | 0 refills | Status: DC

## 2020-12-14 NOTE — Patient Instructions (Addendum)
Medication Instructions:  Your physician recommends that you continue on your current medications as directed. Please refer to the Current Medication list given to you today.  *If you need a refill on your cardiac medications before your next appointment, please call your pharmacy*   Lab Work: BMET today   If you have labs (blood work) drawn today and your tests are completely normal, you will receive your results only by: MyChart Message (if you have MyChart) OR A paper copy in the mail If you have any lab test that is abnormal or we need to change your treatment, we will call you to review the results.   Testing/Procedures: Coronary CT study at [facility]   Follow-Up: At [facility], you and your health needs are our priority.  As part of our continuing mission to provide you with exceptional heart care, we have created designated Provider Care Teams.  These Care Teams include your primary Cardiologist (physician) and Advanced Practice Providers (APPs -  Physician Assistants and Nurse Practitioners) who all work together to provide you with the care you need, when you need it.  We recommend signing up for the patient portal called "MyChart".  Sign up information is provided on this After Visit Summary.  MyChart is used to connect with patients for Virtual Visits (Telemedicine).  Patients are able to view lab/test results, encounter notes, upcoming appointments, etc.  Non-urgent messages can be sent to your provider as well.   To learn more about what you can do with MyChart, go to [email or web address].    Your next appointment:   AS NEEDED with Dr. Antoine Poche - pending results   Other Instructions   Your cardiac CT will be scheduled at one of the below locations:   [facility] [address] [place], [place] [address] [phone]  OR  [facility] [address] [place], [place] [address] [phone]  If scheduled at [facility], please arrive at the [facility] main entrance (entrance A) of [facility] 30 minutes prior to test start time. Proceed to the [facility] Radiology Department (first floor) to check-in and test prep.  If scheduled at [facility], please arrive 15 mins early for check-in and test prep.  Please follow these instructions carefully (unless otherwise directed):  Hold all erectile dysfunction medications at least 3 days (72 hrs) prior to test.  On the Night Before the Test: Be sure to Drink plenty of water. Do not consume any caffeinated/decaffeinated beverages or chocolate 12 hours prior to your test. Do not take any antihistamines 12 hours prior to your test.   On the Day of the Test: Drink plenty of water until 1 hour prior to the test. Do not eat any food 4 hours prior to the test. You may take your regular medications prior to the test.  Take metoprolol (Lopressor) two hours prior to test.   After the Test: Drink plenty of water. After receiving IV contrast, you may experience a mild flushed feeling. This is normal. On occasion, you may experience a mild rash up to 24 hours after the test. This is not dangerous. If this occurs, you can take Benadryl 25 mg and increase your fluid intake. If you experience trouble breathing, this can be serious. If it is severe call 911 IMMEDIATELY. If it is mild, please call our office. If you take any of these medications: Glipizide/Metformin, Avandament, Glucavance, please do not take 48 hours after  completing test unless otherwise instructed.  Please allow 2-4 weeks for scheduling of routine cardiac CTs. Some insurance companies require a pre-authorization which may delay scheduling of this test.   For non-scheduling related questions, please contact the cardiac imaging nurse navigator should you have any questions/concerns: Rockwell Alexandria, Cardiac Imaging Nurse  Navigator Larey Brick, Cardiac Imaging Nurse Navigator  Heart and Vascular Services Direct Office Dial: [phone]   For scheduling needs, including cancellations and rescheduling, please call [place], [phone].

## 2020-12-15 ENCOUNTER — Inpatient Hospital Stay (HOSPITAL_COMMUNITY)
Admission: EM | Admit: 2020-12-15 | Discharge: 2020-12-17 | DRG: 247 | Disposition: A | Discharge status: Home / Self Care | Payer: BC Managed Care – PPO | Attending: Cardiology | Admitting: Cardiology

## 2020-12-15 ENCOUNTER — Inpatient Hospital Stay (HOSPITAL_COMMUNITY)
Admission: EM | Disposition: A | Discharge status: Home / Self Care | Payer: Self-pay | Source: Home / Self Care | Attending: Cardiology

## 2020-12-15 ENCOUNTER — Inpatient Hospital Stay (HOSPITAL_COMMUNITY): Payer: BC Managed Care – PPO

## 2020-12-15 DIAGNOSIS — Z955 Presence of coronary angioplasty implant and graft: Secondary | ICD-10-CM | POA: Diagnosis not present

## 2020-12-15 DIAGNOSIS — Z8249 Family history of ischemic heart disease and other diseases of the circulatory system: Secondary | ICD-10-CM | POA: Diagnosis not present

## 2020-12-15 DIAGNOSIS — E7800 Pure hypercholesterolemia, unspecified: Secondary | ICD-10-CM | POA: Diagnosis not present

## 2020-12-15 DIAGNOSIS — J309 Allergic rhinitis, unspecified: Secondary | ICD-10-CM | POA: Diagnosis present

## 2020-12-15 DIAGNOSIS — Z7952 Long term (current) use of systemic steroids: Secondary | ICD-10-CM

## 2020-12-15 DIAGNOSIS — Z20822 Contact with and (suspected) exposure to covid-19: Secondary | ICD-10-CM | POA: Diagnosis present

## 2020-12-15 DIAGNOSIS — Z79899 Other long term (current) drug therapy: Secondary | ICD-10-CM | POA: Diagnosis not present

## 2020-12-15 DIAGNOSIS — I213 ST elevation (STEMI) myocardial infarction of unspecified site: Secondary | ICD-10-CM

## 2020-12-15 DIAGNOSIS — E785 Hyperlipidemia, unspecified: Secondary | ICD-10-CM | POA: Diagnosis present

## 2020-12-15 DIAGNOSIS — I2102 ST elevation (STEMI) myocardial infarction involving left anterior descending coronary artery: Principal | ICD-10-CM | POA: Diagnosis present

## 2020-12-15 DIAGNOSIS — R231 Pallor: Secondary | ICD-10-CM | POA: Diagnosis not present

## 2020-12-15 DIAGNOSIS — I499 Cardiac arrhythmia, unspecified: Secondary | ICD-10-CM | POA: Diagnosis not present

## 2020-12-15 DIAGNOSIS — L4050 Arthropathic psoriasis, unspecified: Secondary | ICD-10-CM | POA: Diagnosis present

## 2020-12-15 DIAGNOSIS — I2510 Atherosclerotic heart disease of native coronary artery without angina pectoris: Secondary | ICD-10-CM | POA: Diagnosis present

## 2020-12-15 DIAGNOSIS — R079 Chest pain, unspecified: Secondary | ICD-10-CM | POA: Diagnosis not present

## 2020-12-15 DIAGNOSIS — I25110 Atherosclerotic heart disease of native coronary artery with unstable angina pectoris: Secondary | ICD-10-CM | POA: Diagnosis not present

## 2020-12-15 DIAGNOSIS — Z7982 Long term (current) use of aspirin: Secondary | ICD-10-CM

## 2020-12-15 DIAGNOSIS — I10 Essential (primary) hypertension: Secondary | ICD-10-CM | POA: Diagnosis present

## 2020-12-15 DIAGNOSIS — R0789 Other chest pain: Secondary | ICD-10-CM | POA: Diagnosis not present

## 2020-12-15 HISTORY — PX: LEFT HEART CATH AND CORONARY ANGIOGRAPHY: CATH118249

## 2020-12-15 HISTORY — PX: CORONARY/GRAFT ACUTE MI REVASCULARIZATION: CATH118305

## 2020-12-15 LAB — LIPID PANEL
Cholesterol: 120 mg/dL (ref 0–200)
HDL: 32 mg/dL — ABNORMAL LOW (ref >40)
LDL Cholesterol: 64 mg/dL (ref 0–99)
Total CHOL/HDL Ratio: 3.8 RATIO
Triglycerides: 119 mg/dL (ref <150)
VLDL: 24 mg/dL (ref 0–40)

## 2020-12-15 LAB — RESP PANEL BY RT-PCR (FLU A&B, COVID) ARPGX2
Influenza A by PCR: NEGATIVE
Influenza B by PCR: NEGATIVE
SARS Coronavirus 2 by RT PCR: NEGATIVE

## 2020-12-15 LAB — HEMOGLOBIN A1C
Hgb A1c MFr Bld: 5.7 % — ABNORMAL HIGH (ref 4.8–5.6)
Mean Plasma Glucose: 116.89 mg/dL

## 2020-12-15 LAB — PROTIME-INR
INR: 1 (ref 0.8–1.2)
Prothrombin Time: 12.8 seconds (ref 11.4–15.2)

## 2020-12-15 LAB — I-STAT CHEM 8, ED
BUN: 22 mg/dL — ABNORMAL HIGH (ref 6–20)
Calcium, Ion: 1.09 mmol/L — ABNORMAL LOW (ref 1.15–1.40)
Chloride: 106 mmol/L (ref 98–111)
Creatinine, Ser: 1.4 mg/dL — ABNORMAL HIGH (ref 0.61–1.24)
Glucose, Bld: 120 mg/dL — ABNORMAL HIGH (ref 70–99)
HCT: 40 % (ref 39.0–52.0)
Hemoglobin: 13.6 g/dL (ref 13.0–17.0)
Potassium: 3.6 mmol/L (ref 3.5–5.1)
Sodium: 139 mmol/L (ref 135–145)
TCO2: 24 mmol/L (ref 22–32)

## 2020-12-15 LAB — CBC WITH DIFFERENTIAL/PLATELET
Abs Immature Granulocytes: 0.08 10*3/uL — ABNORMAL HIGH (ref 0.00–0.07)
Basophils Absolute: 0 10*3/uL (ref 0.0–0.1)
Basophils Relative: 0 %
Eosinophils Absolute: 0 10*3/uL (ref 0.0–0.5)
Eosinophils Relative: 0 %
HCT: 42.8 % (ref 39.0–52.0)
Hemoglobin: 14.2 g/dL (ref 13.0–17.0)
Immature Granulocytes: 1 %
Lymphocytes Relative: 11 %
Lymphs Abs: 1.8 10*3/uL (ref 0.7–4.0)
MCH: 29.9 pg (ref 26.0–34.0)
MCHC: 33.2 g/dL (ref 30.0–36.0)
MCV: 90.1 fL (ref 80.0–100.0)
Monocytes Absolute: 0.8 10*3/uL (ref 0.1–1.0)
Monocytes Relative: 5 %
Neutro Abs: 12.9 10*3/uL — ABNORMAL HIGH (ref 1.7–7.7)
Neutrophils Relative %: 83 %
Platelets: 269 10*3/uL (ref 150–400)
RBC: 4.75 MIL/uL (ref 4.22–5.81)
RDW: 14.6 % (ref 11.5–15.5)
WBC: 15.6 10*3/uL — ABNORMAL HIGH (ref 4.0–10.5)
nRBC: 0 % (ref 0.0–0.2)

## 2020-12-15 LAB — TROPONIN I (HIGH SENSITIVITY)
Troponin I (High Sensitivity): 16 ng/L (ref <18)
Troponin I (High Sensitivity): 188 ng/L (ref <18)
Troponin I (High Sensitivity): 1973 ng/L (ref <18)

## 2020-12-15 LAB — APTT: aPTT: 25 seconds (ref 24–36)

## 2020-12-15 LAB — COMPREHENSIVE METABOLIC PANEL
ALT: 26 U/L (ref 0–44)
AST: 20 U/L (ref 15–41)
Albumin: 3.7 g/dL (ref 3.5–5.0)
Alkaline Phosphatase: 76 U/L (ref 38–126)
Anion gap: 10 (ref 5–15)
BUN: 19 mg/dL (ref 6–20)
CO2: 22 mmol/L (ref 22–32)
Calcium: 9.1 mg/dL (ref 8.9–10.3)
Chloride: 107 mmol/L (ref 98–111)
Creatinine, Ser: 1.46 mg/dL — ABNORMAL HIGH (ref 0.61–1.24)
GFR, Estimated: 57 mL/min — ABNORMAL LOW (ref >60)
Glucose, Bld: 124 mg/dL — ABNORMAL HIGH (ref 70–99)
Potassium: 3.8 mmol/L (ref 3.5–5.1)
Sodium: 139 mmol/L (ref 135–145)
Total Bilirubin: 1.5 mg/dL — ABNORMAL HIGH (ref 0.3–1.2)
Total Protein: 6.4 g/dL — ABNORMAL LOW (ref 6.5–8.1)

## 2020-12-15 LAB — BASIC METABOLIC PANEL
BUN/Creatinine Ratio: 13 (ref 9–20)
BUN: 17 mg/dL (ref 6–24)
CO2: 24 mmol/L (ref 20–29)
Calcium: 9.2 mg/dL (ref 8.7–10.2)
Chloride: 100 mmol/L (ref 96–106)
Creatinine, Ser: 1.36 mg/dL — ABNORMAL HIGH (ref 0.76–1.27)
Glucose: 85 mg/dL (ref 65–99)
Potassium: 4.6 mmol/L (ref 3.5–5.2)
Sodium: 138 mmol/L (ref 134–144)
eGFR: 62 mL/min/{1.73_m2} (ref >59)

## 2020-12-15 LAB — HIV ANTIBODY (ROUTINE TESTING W REFLEX): HIV Screen 4th Generation wRfx: NONREACTIVE

## 2020-12-15 LAB — MAGNESIUM: Magnesium: 1.9 mg/dL (ref 1.7–2.4)

## 2020-12-15 LAB — POCT ACTIVATED CLOTTING TIME: Activated Clotting Time: 335 seconds

## 2020-12-15 SURGERY — CORONARY/GRAFT ACUTE MI REVASCULARIZATION
Anesthesia: LOCAL

## 2020-12-15 MED ORDER — TICAGRELOR 90 MG PO TABS
ORAL_TABLET | ORAL | Status: AC
  Filled 2020-12-15: qty 2

## 2020-12-15 MED ORDER — VERAPAMIL HCL 2.5 MG/ML IV SOLN
INTRAVENOUS | Status: AC
  Filled 2020-12-15: qty 2

## 2020-12-15 MED ORDER — HEPARIN SODIUM (PORCINE) 5000 UNIT/ML IJ SOLN
INTRAMUSCULAR | Status: AC
  Administered 2020-12-15: 4000 [IU] via INTRAVENOUS
  Filled 2020-12-15: qty 1

## 2020-12-15 MED ORDER — SODIUM CHLORIDE 0.9 % IV SOLN: 250.0000 mL | INTRAVENOUS | Status: DC | PRN

## 2020-12-15 MED ORDER — ONDANSETRON HCL 4 MG/2ML IJ SOLN: 4.0000 mg | Freq: Four times a day (QID) | INTRAMUSCULAR | Status: DC | PRN

## 2020-12-15 MED ORDER — ENOXAPARIN SODIUM 40 MG/0.4ML IJ SOSY
40.0000 mg | PREFILLED_SYRINGE | INTRAMUSCULAR | Status: DC
  Administered 2020-12-16 – 2020-12-17 (×2): 40 mg via SUBCUTANEOUS
  Filled 2020-12-15 (×2): qty 0.4

## 2020-12-15 MED ORDER — HEPARIN SODIUM (PORCINE) 5000 UNIT/ML IJ SOLN: 4000.0000 [IU] | Freq: Once | INTRAMUSCULAR | Status: AC

## 2020-12-15 MED ORDER — LIDOCAINE HCL (PF) 1 % IJ SOLN
INTRAMUSCULAR | Status: DC | PRN
  Administered 2020-12-15: 2 mL

## 2020-12-15 MED ORDER — SODIUM CHLORIDE 0.9% FLUSH
3.0000 mL | Freq: Two times a day (BID) | INTRAVENOUS | Status: DC
  Administered 2020-12-15 – 2020-12-17 (×4): 3 mL via INTRAVENOUS

## 2020-12-15 MED ORDER — NITROGLYCERIN 1 MG/10 ML FOR IR/CATH LAB
INTRA_ARTERIAL | Status: DC | PRN
  Administered 2020-12-15: 200 ug via INTRACORONARY

## 2020-12-15 MED ORDER — ATORVASTATIN CALCIUM 80 MG PO TABS
80.0000 mg | ORAL_TABLET | Freq: Every day | ORAL | Status: DC
  Administered 2020-12-16: 80 mg via ORAL
  Filled 2020-12-15: qty 1

## 2020-12-15 MED ORDER — ASPIRIN EC 81 MG PO TBEC
81.0000 mg | DELAYED_RELEASE_TABLET | Freq: Every day | ORAL | Status: DC
  Administered 2020-12-16 – 2020-12-17 (×2): 81 mg via ORAL
  Filled 2020-12-15 (×2): qty 1

## 2020-12-15 MED ORDER — NITROGLYCERIN 1 MG/10 ML FOR IR/CATH LAB
INTRA_ARTERIAL | Status: AC
  Filled 2020-12-15: qty 10

## 2020-12-15 MED ORDER — METOPROLOL SUCCINATE ER 25 MG PO TB24
25.0000 mg | ORAL_TABLET | Freq: Every day | ORAL | Status: DC
  Administered 2020-12-15 – 2020-12-16 (×2): 25 mg via ORAL
  Filled 2020-12-15 (×2): qty 1

## 2020-12-15 MED ORDER — FENTANYL CITRATE (PF) 100 MCG/2ML IJ SOLN
INTRAMUSCULAR | Status: AC
  Filled 2020-12-15: qty 2

## 2020-12-15 MED ORDER — FENTANYL CITRATE (PF) 100 MCG/2ML IJ SOLN
INTRAMUSCULAR | Status: DC | PRN
  Administered 2020-12-15: 50 ug via INTRAVENOUS

## 2020-12-15 MED ORDER — ATORVASTATIN CALCIUM 80 MG PO TABS: 80.0000 mg | ORAL_TABLET | Freq: Every day | ORAL | Status: DC

## 2020-12-15 MED ORDER — SODIUM CHLORIDE 0.9% FLUSH: 3.0000 mL | INTRAVENOUS | Status: DC | PRN

## 2020-12-15 MED ORDER — ASPIRIN 81 MG PO CHEW
CHEWABLE_TABLET | ORAL | Status: AC
  Administered 2020-12-15: 324 mg via ORAL
  Filled 2020-12-15: qty 4

## 2020-12-15 MED ORDER — DOXYLAMINE SUCCINATE (SLEEP) 25 MG PO TABS
25.0000 mg | ORAL_TABLET | Freq: Once | ORAL | Status: AC
  Administered 2020-12-15: 25 mg via ORAL
  Filled 2020-12-15: qty 1

## 2020-12-15 MED ORDER — TICAGRELOR 90 MG PO TABS
90.0000 mg | ORAL_TABLET | Freq: Two times a day (BID) | ORAL | Status: DC
  Administered 2020-12-15 – 2020-12-17 (×4): 90 mg via ORAL
  Filled 2020-12-15 (×4): qty 1

## 2020-12-15 MED ORDER — SODIUM CHLORIDE 0.9 % IV SOLN
INTRAVENOUS | Status: DC
Start: 2020-12-15 — End: 2020-12-17

## 2020-12-15 MED ORDER — SODIUM CHLORIDE 0.9 % WEIGHT BASED INFUSION
1.0000 mL/kg/h | INTRAVENOUS | Status: AC
  Administered 2020-12-15: 1 mL/kg/h via INTRAVENOUS

## 2020-12-15 MED ORDER — HEPARIN SODIUM (PORCINE) 1000 UNIT/ML IJ SOLN
INTRAMUSCULAR | Status: DC | PRN
  Administered 2020-12-15: 7000 [IU] via INTRAVENOUS

## 2020-12-15 MED ORDER — CHLORHEXIDINE GLUCONATE CLOTH 2 % EX PADS
6.0000 | MEDICATED_PAD | Freq: Every day | CUTANEOUS | Status: DC
  Administered 2020-12-15 – 2020-12-16 (×2): 6 via TOPICAL

## 2020-12-15 MED ORDER — ASPIRIN 81 MG PO CHEW: 324.0000 mg | CHEWABLE_TABLET | Freq: Once | ORAL | Status: AC

## 2020-12-15 MED ORDER — ENOXAPARIN SODIUM 40 MG/0.4ML IJ SOSY: 40.0000 mg | PREFILLED_SYRINGE | INTRAMUSCULAR | Status: DC

## 2020-12-15 MED ORDER — MIDAZOLAM HCL 2 MG/2ML IJ SOLN
INTRAMUSCULAR | Status: AC
  Filled 2020-12-15: qty 2

## 2020-12-15 MED ORDER — NITROGLYCERIN 0.4 MG SL SUBL
0.4000 mg | SUBLINGUAL_TABLET | SUBLINGUAL | Status: DC | PRN
  Administered 2020-12-15: 0.4 mg via SUBLINGUAL
  Filled 2020-12-15: qty 1

## 2020-12-15 MED ORDER — ACETAMINOPHEN 325 MG PO TABS
650.0000 mg | ORAL_TABLET | ORAL | Status: DC | PRN
  Administered 2020-12-15 – 2020-12-16 (×2): 650 mg via ORAL
  Filled 2020-12-15 (×2): qty 2

## 2020-12-15 MED ORDER — MORPHINE SULFATE (PF) 2 MG/ML IV SOLN
2.0000 mg | INTRAVENOUS | Status: DC | PRN
  Administered 2020-12-15 (×2): 2 mg via INTRAVENOUS
  Filled 2020-12-15 (×2): qty 1

## 2020-12-15 MED ORDER — TICAGRELOR 90 MG PO TABS
ORAL_TABLET | ORAL | Status: DC | PRN
  Administered 2020-12-15: 180 mg via ORAL

## 2020-12-15 MED ORDER — HEPARIN (PORCINE) IN NACL 1000-0.9 UT/500ML-% IV SOLN
INTRAVENOUS | Status: DC | PRN
  Administered 2020-12-15 (×2): 500 mL

## 2020-12-15 MED ORDER — HEPARIN (PORCINE) IN NACL 1000-0.9 UT/500ML-% IV SOLN
INTRAVENOUS | Status: AC
  Filled 2020-12-15: qty 1000

## 2020-12-15 MED ORDER — HEPARIN SODIUM (PORCINE) 1000 UNIT/ML IJ SOLN
INTRAMUSCULAR | Status: AC
  Filled 2020-12-15: qty 1

## 2020-12-15 MED ORDER — LIDOCAINE HCL (PF) 1 % IJ SOLN
INTRAMUSCULAR | Status: AC
  Filled 2020-12-15: qty 30

## 2020-12-15 MED ORDER — VERAPAMIL HCL 2.5 MG/ML IV SOLN
INTRAVENOUS | Status: DC | PRN
  Administered 2020-12-15: 10 mL via INTRA_ARTERIAL

## 2020-12-15 SURGICAL SUPPLY — 15 items
BALLN SAPPHIRE 2.5X12 (BALLOONS) ×2
BALLOON SAPPHIRE 2.5X12 (BALLOONS) IMPLANT
CATH 5FR JL3.5 JR4 ANG PIG MP (CATHETERS) ×1 IMPLANT
CATH VISTA GUIDE 6FR XBLAD3.5 (CATHETERS) ×1 IMPLANT
DEVICE RAD COMP TR BAND LRG (VASCULAR PRODUCTS) ×1 IMPLANT
GLIDESHEATH SLEND SS 6F .021 (SHEATH) ×1 IMPLANT
GUIDEWIRE INQWIRE 1.5J.035X260 (WIRE) IMPLANT
INQWIRE 1.5J .035X260CM (WIRE) ×2
KIT ENCORE 26 ADVANTAGE (KITS) ×1 IMPLANT
KIT HEART LEFT (KITS) ×2 IMPLANT
PACK CARDIAC CATHETERIZATION (CUSTOM PROCEDURE TRAY) ×2 IMPLANT
STENT ONYX FRONTIER 3.0X15 (Permanent Stent) ×1 IMPLANT
TRANSDUCER W/STOPCOCK (MISCELLANEOUS) ×2 IMPLANT
TUBING CIL FLEX 10 FLL-RA (TUBING) ×2 IMPLANT
WIRE ASAHI PROWATER 180CM (WIRE) ×1 IMPLANT

## 2020-12-15 NOTE — ED Triage Notes (Signed)
Pt via EMS as code stemi-was playing golf when he began to have chest pain. Stopped on the side of the road and called 911. Pt took 2 nitro before EMS arrival. EMS gave 4 mg zofran and 50 mcg fentanyl.

## 2020-12-15 NOTE — H&P (Signed)
Cardiology Admission History and Physical:   Patient ID: Kevin Walsh MRN: [id number]; DOB: Sep 03, 1965   Admission date: 12/15/2020  PCP:  Merlene Laughter, MD   [facility] Providers Cardiologist:  Rollene Rotunda, MD        Chief Complaint:  chest pain  Patient Profile:   Kevin Walsh is a 55 y.o. male with Recent onset angina  who is being seen 12/15/2020 for the evaluation of Acute anterolateral STEMI.  History of Present Illness:   Kevin Walsh has a history of HTN and HLD. He was seen in the office yesterday by Dr Antoine Poche. He had 2 separate episodes of chest pain while playing golf. Lasted about 30 minutes. Ecg was normal and he was scheduled for a coronary CTA. Today he was again playing golf but developed mid sternal chest pain radiating to his arm. He states it was much worse than before. He was sweating and SOB. EMS was called and Ecg showed anterolateral and inferior ST elevation. Code STEMI was activated.    Past Medical History:  Diagnosis Date   Allergic rhinitis, cause unspecified    Dyslipidemia    Hypertension    Palpitations    Psoriatic arthritis (HCC)     Past Surgical History:  Procedure Laterality Date   None       Medications Prior to Admission: Prior to Admission medications   Medication Sig Start Date End Date Taking? Authorizing Provider  ASPIRIN LOW DOSE 81 MG EC tablet Take 81 mg by mouth daily. 12/10/20   [provider]  Ixekizumab (TALTZ) 80 MG/ML SOAJ inject 80mg  subcutaneously  every 4 weeks 11/15/18   [provider]  lisinopril (ZESTRIL) 10 MG tablet 1 tablet 08/24/19   [provider]  metoprolol tartrate (LOPRESSOR) 100 MG tablet Take 1 tablet (100 mg total) by mouth once for 1 dose. Take 90-120 minutes prior to CT test 12/14/20 12/14/20  12/16/20, MD  nitroGLYCERIN (NITROSTAT) 0.4 MG SL tablet Place 0.4 mg under the tongue as directed. 12/10/20   [provider]  predniSONE (DELTASONE) 5 MG  tablet 1 tablet 10/24/20   [provider]  rosuvastatin (CRESTOR) 5 MG tablet Take 5 mg by mouth daily. 12/04/20   [provider]     Allergies:   No Known Allergies  Social History:   Social History   Socioeconomic History   Marital status: Married    Spouse name: Not on file   Number of children: Not on file   Years of education: Not on file   Highest education level: Not on file  Occupational History   Not on file  Tobacco Use   Smoking status: Never   Smokeless tobacco: Never  Substance and Sexual Activity   Alcohol use: Not on file   Drug use: Not on file   Sexual activity: Not on file  Other Topics Concern   Not on file  Social History Narrative   Lives with wife.  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    Social Determinants of Health   Financial Resource Strain: Not on file  Food Insecurity: Not on file  Transportation Needs: Not on file  Physical Activity: Not on file  Stress: Not on file  Social Connections: Not on file  Intimate Partner Violence: Not on file    Family History:   The patient's family history includes Congestive Heart Failure in his mother; Hypertension in his mother.    ROS:  Please see the history of present illness.  All other ROS reviewed and negative.     Physical Exam/Data:   Vitals:   12/15/20 1224 12/15/20 1230 12/15/20 1240  BP: (!) 129/97 (!) 135/91   Pulse: 96 96   Resp: 16 17   Temp: (!) 97.5 F (36.4 C)    TempSrc: Temporal    SpO2: 99% 98% 100%   No intake or output data in the 24 hours ending 12/15/20 1338 Last 3 Weights 12/14/2020  Weight (lbs) 185 lb 9.6 oz  Weight (kg) 84.188 kg     There is no height or weight on file to calculate BMI.  General:  Well nourished, well developed, in no acute distress HEENT: normal Lymph: no adenopathy Neck: no JVD Endocrine:  No thryomegaly Vascular: No carotid bruits; FA pulses 2+ bilaterally without bruits  Cardiac:  normal S1, S2; RRR; no murmur  Lungs:  clear  to auscultation bilaterally, no wheezing, rhonchi or rales  Abd: soft, nontender, no hepatomegaly  Ext: no edema Musculoskeletal:  No deformities, BUE and BLE strength normal and equal Skin: warm and dry  Neuro:  CNs 2-12 intact, no focal abnormalities noted Psych:  Normal affect    EKG:  The ECG that was done today was personally reviewed and demonstrates NSR with ST elevation in anterolateral and inferior leads.   Relevant CV Studies: none  Laboratory Data:  High Sensitivity Troponin:  No results for input(s): TROPONINIHS in the last 720 hours.    Chemistry Recent Labs  Lab 12/14/20 1552 12/15/20 1233  NA 138 139  K 4.6 3.6  CL 100 106  CO2 24  --   GLUCOSE 85 120*  BUN 17 22*  CREATININE 1.36* 1.40*  CALCIUM 9.2  --     No results for input(s): PROT, ALBUMIN, AST, ALT, ALKPHOS, BILITOT in the last 168 hours. Hematology Recent Labs  Lab 12/15/20 1223 12/15/20 1233  WBC 15.6*  --   RBC 4.75  --   HGB 14.2 13.6  HCT 42.8 40.0  MCV 90.1  --   MCH 29.9  --   MCHC 33.2  --   RDW 14.6  --   PLT 269  --    BNPNo results for input(s): BNP, PROBNP in the last 168 hours.  DDimer No results for input(s): DDIMER in the last 168 hours.   Radiology/Studies:  CARDIAC CATHETERIZATION  Result Date: 12/15/2020   Prox LAD to Mid LAD lesion is 99% stenosed.   Dist LAD lesion is 100% stenosed.   Prox Cx lesion is 25% stenosed.   A drug-eluting stent was successfully placed using a STENT ONYX FRONTIER 3.0X15.   Post intervention, there is a 0% residual stenosis.   The left ventricular systolic function is normal.   LV end diastolic pressure is normal.   The left ventricular ejection fraction is 50-55% by visual estimate.   There is no aortic valve stenosis. 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 Some distal embolization to the far distal apical LAD Good overall LV function with apical HK Normal LVEDP Successful PCI of the mid LAD with DES Plan: DAPT for  one year. Risk factor modification. May be a candidate for fast track discharge.     Assessment and Plan:   Acute anterolateral STEMI. Recent onset of angina. S/p emergent cardiac cath with findings of ruptured plaque in the mid LAD with thrombus. Successfully stented with DES. Plan DAPT for one year. Risk factor modification. Assess LV with Echo.  HTN. On lisinopril. Will initially  start with beta blocker. Can resume ACEi if BP allows HLD. Initiate high dose statin therapy   Risk Assessment/Risk Scores:    TIMI Risk Score for ST  Elevation MI:   The patient's TIMI risk score is 1, which indicates a 1.6% risk of all cause mortality at 30 days.        Severity of Illness: The appropriate patient status for this patient is INPATIENT. Inpatient status is judged to be reasonable and necessary in order to provide the required intensity of service to ensure the patient's safety. The patient's presenting symptoms, physical exam findings, and initial radiographic and laboratory data in the context of their chronic comorbidities is felt to place them at high risk for further clinical deterioration. Furthermore, it is not anticipated that the patient will be medically stable for discharge from the hospital within 2 midnights of admission. The following factors support the patient status of inpatient.   " The patient's presenting symptoms include acute chest pain. " The worrisome physical exam findings include none. " The initial radiographic and laboratory data are worrisome because of ST elevation on Ecg. " The chronic co-morbidities include HTN, HLD.   * I certify that at the point of admission it is my clinical judgment that the patient will require inpatient hospital care spanning beyond 2 midnights from the point of admission due to high intensity of service, high risk for further deterioration and high frequency of surveillance required.*   For questions or updates, please contact [facility] Please consult [email or web address] for contact info under     Signed, Artem Bunte [place], MD  12/15/2020 1:38 PM

## 2020-12-15 NOTE — Progress Notes (Signed)
Date and time results received: 12/15/20 1639  Test: troponin   Critical Value: 188  Name of Provider Notified: none  Orders Received? Or Actions Taken?: none, expected result

## 2020-12-15 NOTE — ED Provider Notes (Signed)
[facility] EMERGENCY DEPARTMENT Provider Note   CSN: [id number] Arrival date & time: 12/15/20  1203     History No chief complaint on file.   Kevin Walsh is a 55 y.o. male.  Patient is a 55 yo male presenting for chest pain. Patient admits to sternal chest pain with radiation down right upper extremity that started while playing golf this morning at 10AM. Patient states he took NG x 2 with improvement of symptoms. Pt received fentanyl 50 mcg and zofran in route. ST segment elevation seen in anterolateral leads. Pt denies prior hx of MI. Admits to hypertension and hyperlipidemia.   The history is provided by the patient. No language interpreter was used.      Past Medical History:  Diagnosis Date   Allergic rhinitis, cause unspecified    Dyslipidemia    Hypertension    Palpitations    Psoriatic arthritis [facility])     Patient Active Problem List   Diagnosis Date Noted   Precordial chest pain 12/13/2020   Palpitations 12/13/2020    Past Surgical History:  Procedure Laterality Date   None         Family History  Problem Relation Age of Onset   Hypertension Mother    Congestive Heart Failure Mother        Diastolic    Social History   Tobacco Use   Smoking status: Never   Smokeless tobacco: Never    Home Medications Prior to Admission medications   Medication Sig Start Date End Date Taking? Authorizing Provider  ASPIRIN LOW DOSE 81 MG EC tablet Take 81 mg by mouth daily. 12/10/20   [provider]  Ixekizumab (TALTZ) 80 MG/ML SOAJ inject 80mg  subcutaneously  every 4 weeks 11/15/18   [provider]  lisinopril (ZESTRIL) 10 MG tablet 1 tablet 08/24/19   [provider]  metoprolol tartrate (LOPRESSOR) 100 MG tablet Take 1 tablet (100 mg total) by mouth once for 1 dose. Take 90-120 minutes prior to CT test 12/14/20 12/14/20  12/16/20, MD  nitroGLYCERIN (NITROSTAT) 0.4 MG SL tablet Place 0.4 mg under the tongue as  directed. 12/10/20   [provider]  predniSONE (DELTASONE) 5 MG tablet 1 tablet 10/24/20   [provider]  rosuvastatin (CRESTOR) 5 MG tablet Take 5 mg by mouth daily. 12/04/20   [provider]    Allergies    Patient has no known allergies.  Review of Systems   Review of Systems  Constitutional:  Negative for chills and fever.  HENT:  Negative for ear pain and sore throat.   Eyes:  Negative for pain and visual disturbance.  Respiratory:  Negative for cough and shortness of breath.   Cardiovascular:  Positive for chest pain. Negative for palpitations.  Gastrointestinal:  Negative for abdominal pain and vomiting.  Genitourinary:  Negative for dysuria and hematuria.  Musculoskeletal:  Negative for arthralgias and back pain.  Skin:  Negative for color change and rash.  Neurological:  Negative for seizures and syncope.  All other systems reviewed and are negative.  Physical Exam Updated Vital Signs BP (!) 129/97 (BP Location: Right Arm)   Pulse 96   Temp (!) 97.5 F (36.4 C) (Temporal)   Resp 16   SpO2 99%   Physical Exam Vitals and nursing note reviewed.  Constitutional:      Appearance: He is well-developed.  HENT:     Head: Normocephalic and atraumatic.  Eyes:     Conjunctiva/sclera: Conjunctivae  normal.  Cardiovascular:     Rate and Rhythm: Normal rate and regular rhythm.     Heart sounds: No murmur heard. Pulmonary:     Effort: Pulmonary effort is normal. No respiratory distress.     Breath sounds: Normal breath sounds.  Abdominal:     Palpations: Abdomen is soft.     Tenderness: There is no abdominal tenderness.  Musculoskeletal:     Cervical back: Neck supple.  Skin:    General: Skin is warm and dry.  Neurological:     Mental Status: He is alert.    ED Results / Procedures / Treatments   Labs (all labs ordered are listed, but only abnormal results are displayed) Labs Reviewed  HEMOGLOBIN A1C  CBC WITH DIFFERENTIAL/PLATELET   PROTIME-INR  APTT  COMPREHENSIVE METABOLIC PANEL  LIPID PANEL  TROPONIN I (HIGH SENSITIVITY)    EKG None  Radiology No results found.  Procedures .Critical Care  Date/Time: 12/15/2020 12:30 PM Performed by: Franne Forts, DO Authorized by: Franne Forts, DO   Critical care provider statement:    Critical care time (minutes):  31   Critical care was necessary to treat or prevent imminent or life-threatening deterioration of the following conditions:  Cardiac failure (STEMI)   Critical care was time spent personally by me on the following activities:  Development of treatment plan with patient or surrogate, discussions with consultants, examination of patient, review of old charts, re-evaluation of patient's condition, pulse oximetry, ordering and review of radiographic studies, ordering and review of laboratory studies and ordering and performing treatments and interventions   I assumed direction of critical care for this patient from another provider in my specialty: yes     Care discussed with comment:  Cardiologist Comments:     STEMI   Medications Ordered in ED Medications  0.9 %  sodium chloride infusion (has no administration in time range)  aspirin chewable tablet 324 mg (has no administration in time range)  heparin injection 4,000 Units (has no administration in time range)    ED Course  I have reviewed the triage vital signs and the nursing notes.  Pertinent labs & imaging results that were available during my care of the patient were reviewed by me and considered in my medical decision making (see chart for details).    MDM Rules/Calculators/A&P                          12:20 PM  55 yo male presenting for chest pain. Patient Aox3, stable vitals. EKG demonstrates ST segment elevation in anterolateral leads. ASA given. Heparin ordered. IV established, cardiac labs and covid pcr sent.  12:28 PM Cardiology at beside. Patient leaving for Cath Lab. Vitals  remain stable.      Final Clinical Impression(s) / ED Diagnoses Final diagnoses:  ST elevation myocardial infarction (STEMI), unspecified artery [facility])    Rx / DC Orders ED Discharge Orders     None        Franne Forts, DO 12/15/20 1240

## 2020-12-16 ENCOUNTER — Encounter (HOSPITAL_COMMUNITY): Payer: Self-pay | Admitting: Cardiology

## 2020-12-16 ENCOUNTER — Inpatient Hospital Stay (HOSPITAL_COMMUNITY): Payer: BC Managed Care – PPO

## 2020-12-16 DIAGNOSIS — I2102 ST elevation (STEMI) myocardial infarction involving left anterior descending coronary artery: Secondary | ICD-10-CM

## 2020-12-16 LAB — BASIC METABOLIC PANEL
Anion gap: 6 (ref 5–15)
BUN: 18 mg/dL (ref 6–20)
CO2: 25 mmol/L (ref 22–32)
Calcium: 8.6 mg/dL — ABNORMAL LOW (ref 8.9–10.3)
Chloride: 107 mmol/L (ref 98–111)
Creatinine, Ser: 1.21 mg/dL (ref 0.61–1.24)
GFR, Estimated: >60 mL/min (ref >60)
Glucose, Bld: 111 mg/dL — ABNORMAL HIGH (ref 70–99)
Potassium: 3.9 mmol/L (ref 3.5–5.1)
Sodium: 138 mmol/L (ref 135–145)

## 2020-12-16 LAB — LIPID PANEL
Cholesterol: 101 mg/dL (ref 0–200)
HDL: 26 mg/dL — ABNORMAL LOW (ref >40)
LDL Cholesterol: 55 mg/dL (ref 0–99)
Total CHOL/HDL Ratio: 3.9 RATIO
Triglycerides: 100 mg/dL (ref <150)
VLDL: 20 mg/dL (ref 0–40)

## 2020-12-16 LAB — ECHOCARDIOGRAM COMPLETE
Area-P 1/2: 3.42 cm2
S' Lateral: 3.2 cm

## 2020-12-16 LAB — CBC
HCT: 37.9 % — ABNORMAL LOW (ref 39.0–52.0)
Hemoglobin: 12.9 g/dL — ABNORMAL LOW (ref 13.0–17.0)
MCH: 30.1 pg (ref 26.0–34.0)
MCHC: 34 g/dL (ref 30.0–36.0)
MCV: 88.6 fL (ref 80.0–100.0)
Platelets: 249 10*3/uL (ref 150–400)
RBC: 4.28 MIL/uL (ref 4.22–5.81)
RDW: 15.2 % (ref 11.5–15.5)
WBC: 10.4 10*3/uL (ref 4.0–10.5)
nRBC: 0 % (ref 0.0–0.2)

## 2020-12-16 MED ORDER — PERFLUTREN LIPID MICROSPHERE
1.0000 mL | INTRAVENOUS | Status: AC | PRN
  Administered 2020-12-16: 3 mL via INTRAVENOUS
  Filled 2020-12-16: qty 10

## 2020-12-16 MED ORDER — LISINOPRIL 10 MG PO TABS
10.0000 mg | ORAL_TABLET | Freq: Every day | ORAL | Status: DC
  Administered 2020-12-16 – 2020-12-17 (×2): 10 mg via ORAL
  Filled 2020-12-16 (×2): qty 1

## 2020-12-16 MED ORDER — PREDNISONE 5 MG PO TABS
5.0000 mg | ORAL_TABLET | Freq: Every day | ORAL | Status: DC
  Administered 2020-12-16 – 2020-12-17 (×2): 5 mg via ORAL
  Filled 2020-12-16 (×2): qty 1

## 2020-12-16 NOTE — Progress Notes (Signed)
Progress Note  Patient Name: Kevin Walsh Date of Encounter: 12/16/2020  Primary Cardiologist: Rollene Rotunda, MD   Subjective   [facility] performed overnight.  Patient notes small flutter overnight but no further CP.  Inpatient Medications    Scheduled Meds:  aspirin EC  81 mg Oral Daily   atorvastatin  80 mg Oral Q2000   Chlorhexidine Gluconate Cloth  6 each Topical Daily   enoxaparin (LOVENOX) injection  40 mg Subcutaneous Q24H   metoprolol succinate  25 mg Oral Daily   sodium chloride flush  3 mL Intravenous Q12H   ticagrelor  90 mg Oral BID   Continuous Infusions:  sodium chloride     sodium chloride     PRN Meds: sodium chloride, acetaminophen, morphine injection, nitroGLYCERIN, ondansetron (ZOFRAN) IV, sodium chloride flush   Vital Signs    Vitals:   12/16/20 0600 12/16/20 0630 12/16/20 0700 12/16/20 0730  BP: 115/84 (!) 124/92 116/79 (!) 124/95  Pulse: 78 88 73 78  Resp: 14 12 13 15   Temp:      TempSrc:      SpO2: 96% 97% 98% 98%    Intake/Output Summary (Last 24 hours) at 12/16/2020 0744 Last data filed at 12/16/2020 0400 Gross per 24 hour  Intake 456.08 ml  Output 1025 ml  Net -568.92 ml   There were no vitals filed for this visit.  Telemetry    SR with Rare PVCs - Personally Reviewed  ECG    SR short PR non specific TWI; resolution of ST elevations - Personally Reviewed  Physical Exam   GEN: No acute distress.   Neck: No JVD Cardiac: RRR, no murmurs, rubs, or gallops. R radial site  Respiratory: Clear to auscultation bilaterally. GI: Soft, nontender, non-distended  MS: No edema; No deformity. Neuro:  Nonfocal  Psych: Normal affect   Labs    Chemistry Recent Labs  Lab 12/14/20 1552 12/15/20 1223 12/15/20 1233 12/16/20 0438  NA 138 139 139 138  K 4.6 3.8 3.6 3.9  CL 100 107 106 107  CO2 24 22  --  25  GLUCOSE 85 124* 120* 111*  BUN 17 19 22* 18  CREATININE 1.36* 1.46* 1.40* 1.21  CALCIUM 9.2 9.1  --  8.6*  PROT  --  6.4*  --    --   ALBUMIN  --  3.7  --   --   AST  --  20  --   --   ALT  --  26  --   --   ALKPHOS  --  76  --   --   BILITOT  --  1.5*  --   --   GFRNONAA  --  57*  --  >60  ANIONGAP  --  10  --  6     Hematology Recent Labs  Lab 12/15/20 1223 12/15/20 1233 12/16/20 0438  WBC 15.6*  --  10.4  RBC 4.75  --  4.28  HGB 14.2 13.6 12.9*  HCT 42.8 40.0 37.9*  MCV 90.1  --  88.6  MCH 29.9  --  30.1  MCHC 33.2  --  34.0  RDW 14.6  --  15.2  PLT 269  --  249    Cardiac EnzymesNo results for input(s): TROPONINI in the last 168 hours. No results for input(s): TROPIPOC in the last 168 hours.   BNPNo results for input(s): BNP, PROBNP in the last 168 hours.   DDimer No results for input(s): DDIMER in the  last 168 hours.   Radiology    CARDIAC CATHETERIZATION  Result Date: 12/15/2020   Prox LAD to Mid LAD lesion is 99% stenosed.   Dist LAD lesion is 100% stenosed.   Prox Cx lesion is 25% stenosed.   A drug-eluting stent was successfully placed using a STENT ONYX FRONTIER 3.0X15.   Post intervention, there is a 0% residual stenosis.   The left ventricular systolic function is normal.   LV end diastolic pressure is normal.   The left ventricular ejection fraction is 50-55% by visual estimate.   There is no aortic valve stenosis. 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 Some distal embolization to the far distal apical LAD Good overall LV function with apical HK Normal LVEDP Successful PCI of the mid LAD with DES Plan: DAPT for one year. Risk factor modification. May be a candidate for fast track discharge.   DG Chest Portable 1 View  Result Date: 12/15/2020 CLINICAL DATA:  STEMI.  Chest pain. EXAM: PORTABLE CHEST 1 VIEW COMPARISON:  March 26, 2018 FINDINGS: The heart size and mediastinal contours are within normal limits. Both lungs are clear. The visualized skeletal structures are unremarkable. IMPRESSION: No active disease. Electronically Signed   By: Gerome Sam III  M.D.   On: 12/15/2020 14:06    Cardiac Studies   Echo pending (being done now)  Patient Profile     55 y.o. male who presented 12/15/20 with anterior STEMI  Assessment & Plan    Coronary Artery Disease; STEMI - asymptomatic post LAD PCI - continue ASA 81 mg; Continue Brilinta until follow up where plavix transition can be discussed - continue statin, goal LDL < 70 (if issues with tolerability can switch to high dose rosuvastatin) - continue BB - resume ACEi today - Pending Echo - discussed cardiac rehab with patient and wife  Psoriatic Arthritis - last prednisone was 5 mg PO daily - will resume prednisone  Full Code Heart Health diet Lovenox DVT PPX Labs ordered for tomorrow (Potential DC)  To floor today  For questions or updates, please contact [facility] Please consult [email or web address] for contact info under Cardiology/STEMI.      Signed, Christell Constant, MD  12/16/2020, 7:44 AM

## 2020-12-16 NOTE — Plan of Care (Signed)

## 2020-12-16 NOTE — Discharge Instructions (Signed)

## 2020-12-16 NOTE — Progress Notes (Signed)
Echocardiogram 2D Echocardiogram has been performed.  Warren Lacy Dayyan Krist RDCS 12/16/2020, 8:28 AM

## 2020-12-17 ENCOUNTER — Other Ambulatory Visit (HOSPITAL_COMMUNITY): Payer: Self-pay

## 2020-12-17 DIAGNOSIS — I10 Essential (primary) hypertension: Secondary | ICD-10-CM | POA: Diagnosis not present

## 2020-12-17 DIAGNOSIS — I25110 Atherosclerotic heart disease of native coronary artery with unstable angina pectoris: Secondary | ICD-10-CM | POA: Diagnosis not present

## 2020-12-17 DIAGNOSIS — Z955 Presence of coronary angioplasty implant and graft: Secondary | ICD-10-CM | POA: Diagnosis not present

## 2020-12-17 DIAGNOSIS — I2102 ST elevation (STEMI) myocardial infarction involving left anterior descending coronary artery: Secondary | ICD-10-CM | POA: Diagnosis not present

## 2020-12-17 LAB — CBC
HCT: 40.5 % (ref 39.0–52.0)
Hemoglobin: 13.6 g/dL (ref 13.0–17.0)
MCH: 30.2 pg (ref 26.0–34.0)
MCHC: 33.6 g/dL (ref 30.0–36.0)
MCV: 89.8 fL (ref 80.0–100.0)
Platelets: 250 10*3/uL (ref 150–400)
RBC: 4.51 MIL/uL (ref 4.22–5.81)
RDW: 15.1 % (ref 11.5–15.5)
WBC: 12.7 10*3/uL — ABNORMAL HIGH (ref 4.0–10.5)
nRBC: 0 % (ref 0.0–0.2)

## 2020-12-17 LAB — BASIC METABOLIC PANEL
Anion gap: 6 (ref 5–15)
BUN: 14 mg/dL (ref 6–20)
CO2: 29 mmol/L (ref 22–32)
Calcium: 8.5 mg/dL — ABNORMAL LOW (ref 8.9–10.3)
Chloride: 102 mmol/L (ref 98–111)
Creatinine, Ser: 1.31 mg/dL — ABNORMAL HIGH (ref 0.61–1.24)
GFR, Estimated: >60 mL/min (ref >60)
Glucose, Bld: 111 mg/dL — ABNORMAL HIGH (ref 70–99)
Potassium: 3.9 mmol/L (ref 3.5–5.1)
Sodium: 137 mmol/L (ref 135–145)

## 2020-12-17 MED ORDER — DOXYLAMINE SUCCINATE (SLEEP) 25 MG PO TABS
25.0000 mg | ORAL_TABLET | Freq: Every evening | ORAL | Status: DC | PRN
  Administered 2020-12-17: 25 mg via ORAL
  Filled 2020-12-17 (×2): qty 1

## 2020-12-17 MED ORDER — ATORVASTATIN CALCIUM 80 MG PO TABS
80.0000 mg | ORAL_TABLET | Freq: Every day | ORAL | 1 refills | Status: DC
  Filled 2020-12-17: qty 90, 90d supply, fill #0

## 2020-12-17 MED ORDER — METOPROLOL SUCCINATE ER 50 MG PO TB24
50.0000 mg | ORAL_TABLET | Freq: Every day | ORAL | 0 refills | Status: DC
  Filled 2020-12-17: qty 90, 90d supply, fill #0

## 2020-12-17 MED ORDER — METOPROLOL SUCCINATE ER 50 MG PO TB24
50.0000 mg | ORAL_TABLET | Freq: Every day | ORAL | Status: DC
  Administered 2020-12-17: 50 mg via ORAL
  Filled 2020-12-17: qty 1

## 2020-12-17 MED ORDER — TICAGRELOR 90 MG PO TABS
90.0000 mg | ORAL_TABLET | Freq: Two times a day (BID) | ORAL | 2 refills | Status: DC
  Filled 2020-12-17: qty 60, 30d supply, fill #0

## 2020-12-17 MED FILL — Midazolam HCl Inj 2 MG/2ML (Base Equivalent): INTRAMUSCULAR | Qty: 2 | Status: AC

## 2020-12-17 NOTE — TOC Benefit Eligibility Note (Signed)
Patient Product/process development scientist completed.    The patient is currently admitted and upon discharge could be taking Brilinta 90 mg.  The current 30 day co-pay is, $25.00. the mfr of BRILINTA 90 MG TABLET paid 45.00 toward plan copay  The patient is insured through [employer] of [facility]     Roland Earl, CPhT Pharmacy Patient Advocate Specialist [facility] Antimicrobial Stewardship Team Direct Number: [phone]  Fax: [phone]

## 2020-12-17 NOTE — Progress Notes (Signed)
CARDIAC REHAB PHASE I   PRE:  Rate/Rhythm: 90 SR    BP: sitting 125/88    SaO2:   MODE:  Ambulation: 440 ft   POST:  Rate/Rhythm: 101 ST    BP: sitting 131/86     SaO2:   Tolerated well, feels good. Discussed MI, stent, restrictions, Brilinta importance, diet, exercise, NTG, and CRPII with pt and wife. Good reception. Will refer to [facility] (pt works in [employer]).  [phone]   Harriet Masson CES, ACSM 12/17/2020 10:21 AM

## 2020-12-17 NOTE — Discharge Summary (Addendum)
Discharge Summary    Patient ID: Kevin Walsh MRN: [id number]; DOB: 1965/12/27  Admit date: 12/15/2020 Discharge date: 12/17/2020  PCP:  Merlene Laughter, MD   [facility] Providers Cardiologist:  Rollene Rotunda, MD   Discharge Diagnoses    Principal Problem:   STEMI involving left anterior descending coronary artery [facility]) Active Problems:   HTN (hypertension)   Hyperlipidemia   ST elevation myocardial infarction involving left anterior descending (LAD) coronary artery [facility])   Diagnostic Studies/Procedures    Cath: 12/15/20: p-m LAD 99% thrombotic lesion & diatl LAD 100% (distal embolization); LAD DES PCI - STENT ONYX FRONTIER 3.0X15.  Good overall LV function with apical HK.  Normal LVEDP.  Prox LCx 25%.   Plan: DAPT for one year. Risk factor modification. May be a candidate for fast track discharge.   Diagnostic: Dominance: Right      Intervention  Echo: 12/16/20: EF 55-60%. Normal Fxn. Apical Akinesis. Unable to assess RVP. Normal MV & AoV.  _____________   History of Present Illness     Kevin Walsh is a 55 y.o. male with PMH of HTN, Psoriatic Arthritis and HLD who presented with chest pain and acute STEMI. Marland Kitchen He was seen in the office the day prior to admission by Dr Antoine Poche. He had 2 separate episodes of chest pain while playing golf. Lasted about 30 minutes. Ecg was normal and he was scheduled for a coronary CTA. The morning of admission he was again playing golf but developed mid sternal chest pain radiating to his arm. He stated it was much worse than before. He was sweating and SOB. EMS was called and Ecg showed anterolateral and inferior ST elevation. Code STEMI was activated.     Hospital Course     STEMI: hsTn peaked at 56. 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 Placed on DAPT with ASA/Brilinta for at least one  year. No recurrent chest pain post cath. Seen by CR. Follow up echo with EF of 55-60% with akinesis of the LV and entire apical segment.  HTN: Stable with current therapy.  -- Continue on lisinopril and Toprol   HLD: LDL 55 -- switched to high dose atorva 80mg  daily  Psoriatic Arthritis: continue home regimen  -- prednisone 5mg  daily, Taltz  General: Well developed, well nourished, male appearing in no acute distress. Head: Normocephalic, atraumatic.  Neck: Supple without bruits, No JVD or bruits. Lungs:  Resp regular and unlabored, CTA. Heart: RRR, Normal S1& S2, no S3, S4, or murmur; no rub. Abdomen: Soft, non-tender, non-distended with normoactive bowel sounds. No hepatomegaly. No rebound/guarding. No obvious abdominal masses. Extremities: No clubbing, cyanosis, or edema. Distal pedal pulses are 2+ bilaterally. R Radial cath site stable with mild bruising but no hematoma Neuro: Alert and oriented X 3. Moves all extremities spontaneously. Psych: Normal affect.  Patient was seen by Dr. and deemed stable for discharge home. Follow up in the office has been arranged. Medications sent to pharmacy of patient choice. Educated by PharmD prior to discharge.   Did the patient have an acute coronary syndrome (MI, NSTEMI, STEMI, etc) this admission?:  Yes                               AHA/ACC Clinical Performance & Quality Measures: Aspirin prescribed? - Yes ADP Receptor  Inhibitor (Plavix/Clopidogrel, Brilinta/Ticagrelor or Effient/Prasugrel) prescribed (includes medically managed patients)? - Yes Beta Blocker prescribed? - Yes High Intensity Statin (Lipitor 40-80mg  or Crestor 20-40mg ) prescribed? - Yes EF assessed during THIS hospitalization? - Yes For EF <40%, was ACEI/ARB prescribed? - Not Applicable (EF >/= 40%) For EF <40%, Aldosterone Antagonist (Spironolactone or Eplerenone) prescribed? - Not Applicable (EF >/= 40%) Cardiac Rehab Phase II ordered (including medically managed  patients)? - Yes      _____________  Discharge Vitals Blood pressure 115/85, pulse 81, temperature 98.3 F (36.8 C), temperature source Oral, resp. rate 15, height 5\' 7"  (1.702 m), weight 84.2 kg, SpO2 100 %.  Filed Weights   12/17/20 0846  Weight: 84.2 kg    Labs & Radiologic Studies    CBC Recent Labs    12/15/20 1223 12/15/20 1233 12/16/20 0438 12/17/20 0055  WBC 15.6*  --  10.4 12.7*  NEUTROABS 12.9*  --   --   --   HGB 14.2   < > 12.9* 13.6  HCT 42.8   < > 37.9* 40.5  MCV 90.1  --  88.6 89.8  PLT 269  --  249 250   < > = values in this interval not displayed.   Basic Metabolic Panel Recent Labs    16/01/9607/27/22 1449 12/16/20 0438 12/17/20 0055  NA  --  138 137  K  --  3.9 3.9  CL  --  107 102  CO2  --  25 29  GLUCOSE  --  111* 111*  BUN  --  18 14  CREATININE  --  1.21 1.31*  CALCIUM  --  8.6* 8.5*  MG 1.9  --   --    Liver Function Tests Recent Labs    12/15/20 1223  AST 20  ALT 26  ALKPHOS 76  BILITOT 1.5*  PROT 6.4*  ALBUMIN 3.7   No results for input(s): LIPASE, AMYLASE in the last 72 hours. High Sensitivity Troponin:   Recent Labs  Lab 12/15/20 1223 12/15/20 1449 12/15/20 1813  TROPONINIHS 16 188* 1,973*    BNP Invalid input(s): POCBNP D-Dimer No results for input(s): DDIMER in the last 72 hours. Hemoglobin A1C Recent Labs    12/15/20 1223  HGBA1C 5.7*   Fasting Lipid Panel Recent Labs    12/16/20 0438  CHOL 101  HDL 26*  LDLCALC 55  TRIG 045100  CHOLHDL 3.9   Thyroid Function Tests No results for input(s): TSH, T4TOTAL, T3FREE, THYROIDAB in the last 72 hours.  Invalid input(s): FREET3 _____________  CARDIAC CATHETERIZATION  Result Date: 12/15/2020   Prox LAD to Mid LAD lesion is 99% stenosed.   Dist LAD lesion is 100% stenosed.   Prox Cx lesion is 25% stenosed.   A drug-eluting stent was successfully placed using a STENT ONYX FRONTIER 3.0X15.   Post intervention, there is a 0% residual stenosis.   The left ventricular  systolic function is normal.   LV end diastolic pressure is normal.   The left ventricular ejection fraction is 50-55% by visual estimate.   There is no aortic valve stenosis. 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 Some distal embolization to the far distal apical LAD Good overall LV function with apical HK Normal LVEDP Successful PCI of the mid LAD with DES Plan: DAPT for one year. Risk factor modification. May be a candidate for fast track discharge.   DG Chest Portable 1 View  Result Date: 12/15/2020 CLINICAL DATA:  STEMI.  Chest pain. EXAM:  PORTABLE CHEST 1 VIEW COMPARISON:  March 26, 2018 FINDINGS: The heart size and mediastinal contours are within normal limits. Both lungs are clear. The visualized skeletal structures are unremarkable. IMPRESSION: No active disease. Electronically Signed   By: Gerome Sam III M.D.   On: 12/15/2020 14:06   ECHOCARDIOGRAM COMPLETE  Result Date: 12/16/2020    ECHOCARDIOGRAM REPORT   Patient Name:   CURVIN HUNGER Date of Exam: 12/16/2020 Medical Rec #:  [id number]       Height:       67.0 in Accession #:    [id number]      Weight:       185.6 lb Date of Birth:  [birth year]       BSA:          1.959 m Patient Age:    55 years        BP:           124/95 mmHg Patient Gender: M               HR:           90 bpm. Exam Location:  Inpatient Procedure: 2D Echo, Color Doppler, Cardiac Doppler and Intracardiac            Opacification Agent Indications:    Acute MI i21.9  History:        Patient has no prior history of Echocardiogram examinations.                 CAD; Risk Factors:Hypertension and Dyslipidemia.  Sonographer:    Irving Burton Senior RDCS Referring Phys: [phone] PETER M [place]  Sonographer Comments: Definity used to define apex. IMPRESSIONS  1. There is some sluggishe flow in apex that could repreesent early forming thrombus. Left ventricular ejection fraction, by estimation, is 55 to 60%. The left ventricle has normal function. The left  ventricle demonstrates regional wall motion abnormalities (see scoring diagram/findings for description). Left ventricular diastolic parameters were normal. There is akinesis of the left ventricular, entire apical segment.  2. Right ventricular systolic function is normal. The right ventricular size is normal. Tricuspid regurgitation signal is inadequate for assessing PA pressure.  3. The mitral valve is normal in structure. Trivial mitral valve regurgitation. No evidence of mitral stenosis.  4. The aortic valve is normal in structure. Aortic valve regurgitation is not visualized. No aortic stenosis is present.  5. The inferior vena cava is normal in size with greater than 50% respiratory variability, suggesting right atrial pressure of 3 mmHg. FINDINGS  Left Ventricle: There is some sluggishe flow in apex that could repreesent early forming thrombus. Left ventricular ejection fraction, by estimation, is 55 to 60%. The left ventricle has normal function. The left ventricle demonstrates regional wall motion abnormalities. Definity contrast agent was given IV to delineate the left ventricular endocardial borders. The left ventricular internal cavity size was normal in size. There is no left ventricular hypertrophy. Left ventricular diastolic parameters were normal. Normal left ventricular filling pressure. Right Ventricle: The right ventricular size is normal. No increase in right ventricular wall thickness. Right ventricular systolic function is normal. Tricuspid regurgitation signal is inadequate for assessing PA pressure. Left Atrium: Left atrial size was normal in size. Right Atrium: Right atrial size was normal in size. Pericardium: There is no evidence of pericardial effusion. Mitral Valve: The mitral valve is normal in structure. Trivial mitral valve regurgitation. No evidence of mitral valve stenosis. Tricuspid Valve: The tricuspid valve is normal in structure. Tricuspid  valve regurgitation is not  demonstrated. No evidence of tricuspid stenosis. Aortic Valve: The aortic valve is normal in structure. Aortic valve regurgitation is not visualized. No aortic stenosis is present. Pulmonic Valve: The pulmonic valve was normal in structure. Pulmonic valve regurgitation is not visualized. No evidence of pulmonic stenosis. Aorta: The aortic root is normal in size and structure. Venous: The inferior vena cava is normal in size with greater than 50% respiratory variability, suggesting right atrial pressure of 3 mmHg. IAS/Shunts: No atrial level shunt detected by color flow Doppler.  LEFT VENTRICLE PLAX 2D LVIDd:         4.40 cm  Diastology LVIDs:         3.20 cm  LV e' medial:    11.70 cm/s LV PW:         0.70 cm  LV E/e' medial:  7.8 LV IVS:        0.80 cm  LV e' lateral:   10.30 cm/s LVOT diam:     2.00 cm  LV E/e' lateral: 8.8 LV SV:         61 LV SV Index:   31 LVOT Area:     3.14 cm  RIGHT VENTRICLE RV S prime:     12.50 cm/s TAPSE (M-mode): 2.3 cm LEFT ATRIUM             Index       RIGHT ATRIUM           Index LA diam:        3.00 cm 1.53 cm/m  RA Area:     14.60 cm LA Vol (A2C):   26.0 ml 13.27 ml/m RA Volume:   38.60 ml  19.70 ml/m LA Vol (A4C):   34.2 ml 17.46 ml/m LA Biplane Vol: 30.0 ml 15.31 ml/m  AORTIC VALVE LVOT Vmax:   101.00 cm/s LVOT Vmean:  67.000 cm/s LVOT VTI:    0.195 m  AORTA Ao Root diam: 3.00 cm Ao Asc diam:  3.10 cm MITRAL VALVE MV Area (PHT): 3.42 cm    SHUNTS MV Decel Time: 222 msec    Systemic VTI:  0.20 m MV E velocity: 90.70 cm/s  Systemic Diam: 2.00 cm MV A velocity: 80.20 cm/s MV E/A ratio:  1.13 Armanda Magic MD Electronically signed by Armanda Magic MD Signature Date/Time: 12/16/2020/10:15:03 AM    Final    Disposition   Pt is being discharged home today in good condition.  Follow-up Plans & Appointments     Follow-up Information     [facility], [place] Follow up on 12/31/2020.   Specialties: Cardiology, Radiology Why: at 2:45pm for your follow up appt with Dr. Jenene Slicker PA  [facility] Contact information: [address] [place] [address] [phone]                Discharge Instructions     Amb Referral to Cardiac Rehabilitation   Complete by: As directed    To [place]   Diagnosis:  Coronary Stents STEMI PTCA     After initial evaluation and assessments completed: Virtual Based Care may be provided alone or in conjunction with Phase 2 Cardiac Rehab based on patient barriers.: Yes   Diet - low sodium heart healthy   Complete by: As directed    Discharge instructions   Complete by: As directed    Radial Site Care Refer to this sheet in the next few weeks. These instructions provide you with information on caring for  yourself after your procedure. Your caregiver may also give you more specific instructions. Your treatment has been planned according to current medical practices, but problems sometimes occur. Call your caregiver if you have any problems or questions after your procedure. HOME CARE INSTRUCTIONS You may shower the day after the procedure. Remove the bandage (dressing) and gently wash the site with plain soap and water. Gently pat the site dry.  Do not apply powder or lotion to the site.  Do not submerge the affected site in water for 3 to 5 days.  Inspect the site at least twice daily.  Do not flex or bend the affected arm for 24 hours.  No lifting over 5 pounds (2.3 kg) for 5 days after your procedure.  Do not drive home if you are discharged the same day of the procedure. Have someone else drive you.  You may drive 24 hours after the procedure unless otherwise instructed by your caregiver.  What to expect: Any bruising will usually fade within 1 to 2 weeks.  Blood that collects in the tissue (hematoma) may be painful to the touch. It should usually decrease in size and tenderness within 1 to 2 weeks.  SEEK IMMEDIATE MEDICAL CARE IF: You have unusual pain at the radial site.  You have redness, warmth, swelling,  or pain at the radial site.  You have drainage (other than a small amount of blood on the dressing).  You have chills.  You have a fever or persistent symptoms for more than 72 hours.  You have a fever and your symptoms suddenly get worse.  Your arm becomes pale, cool, tingly, or numb.  You have heavy bleeding from the site. Hold pressure on the site.   PLEASE DO NOT MISS ANY DOSES OF YOUR BRILINTA!!!!! Also keep a log of you blood pressures and bring back to your follow up appt. Please call the office with any questions.   Patients taking blood thinners should generally stay away from medicines like ibuprofen, Advil, Motrin, naproxen, and Aleve due to risk of stomach bleeding. You may take Tylenol as directed or talk to your primary doctor about alternatives.   PLEASE ENSURE THAT YOU DO NOT RUN OUT OF YOUR BRILINTA. This medication is very important to remain on for at least one year. IF you have issues obtaining this medication due to cost please CALL the office 3-5 business days prior to running out in order to prevent missing doses of this medication.   Increase activity slowly   Complete by: As directed        Discharge Medications   Allergies as of 12/17/2020   No Known Allergies      Medication List     STOP taking these medications    metoprolol tartrate 100 MG tablet Commonly known as: LOPRESSOR   rosuvastatin 5 MG tablet Commonly known as: CRESTOR       TAKE these medications    Aspirin Low Dose 81 MG EC tablet Generic drug: aspirin Take 81 mg by mouth daily.   atorvastatin 80 MG tablet Commonly known as: LIPITOR Take 1 tablet (80 mg total) by mouth daily at 8 pm.   Brilinta 90 MG Tabs tablet Generic drug: ticagrelor Take 1 tablet (90 mg total) by mouth 2 (two) times daily.   lisinopril 10 MG tablet Commonly known as: ZESTRIL Take 10 mg by mouth daily.   metoprolol succinate 50 MG 24 hr tablet Commonly known as: TOPROL-XL Take 1 tablet (50 mg  total)  by mouth daily. Take with or immediately following a meal. Start taking on: December 18, 2020   nitroGLYCERIN 0.4 MG SL tablet Commonly known as: NITROSTAT Place 0.4 mg under the tongue every 5 (five) minutes as needed for chest pain.   predniSONE 5 MG tablet Commonly known as: DELTASONE Take 5 mg by mouth daily with breakfast.   Taltz 80 MG/ML Soaj Generic drug: Ixekizumab Inject 80 mg into the skin every 30 (thirty) days.        Outstanding Labs/Studies   FLP/LFTs in 8 weeks  Duration of Discharge Encounter   Greater than 30 minutes including physician time.  Signed, Laverda Page, NP 12/17/2020, 11:59 AM  ATTENDING ATTESTATION  I have seen, examined and evaluated the patient this AM on rounds along with Laverda Page, NP-C.  After reviewing all the available data and chart, we discussed the patients laboratory, study & physical findings as well as symptoms in detail. I agree with her findings, (my) examination as well as impression recommendations as per our discussion.    Attending adjustments noted in italics.   Feeling well.  No further complications after his anterior MI.  There was concern about possible LV apical thrombus due to the distal embolization down the LAD with apical LAD thrombus noted on cath.  I reviewed the echocardiogram with Dr. Bjorn Pippin --> on reviewing Definity contrasted images, there does not appear to be any apical thrombus.  There is some apical ballooning and hypokinesis consistent with apical infarct, but otherwise preserved EF.  He is hemodynamically stable with no active symptoms.  On stable medications.  Ready for discharge.  I agree with the remainder the discharge summary.  He was seen by Dr. Antoine Poche on the day prior to his MI and was doing fine, unfortunately he proceeded to have an MRI the following day.  He will therefore follow-up with Dr. Antoine Poche in outpatient setting.    Bryan Lemma, M.D., M.S. Interventional  Cardiologist   Pager # [phone] Phone # [phone] [address] [place], [place] [address]

## 2020-12-17 NOTE — TOC Benefit Eligibility Note (Signed)
Transition of Care [facility]) Benefit Eligibility Note    Patient Details  Name: Kevin Walsh MRN: [id number] Date of Birth: 1965/06/18   Medication/Dose: BRILINTA  90 MG BID  Covered?: Yes  Tier: 3 Drug  Prescription Coverage [facility]  , [facility] TRANSITIONS OF CARE [facility]  Spoke with Person/Company/Phone Number:: RAQUEL  @ [employer]  DEDICATED TEAM  #  [phone]  Co-Pay: $70.00  Prior Approval: No  Deductible: Unmet (OUT-OF-POCKET:UNMET)       Mardene Sayer Phone Number: 12/17/2020, 2:31 PM

## 2020-12-18 ENCOUNTER — Telehealth: Payer: Self-pay

## 2020-12-18 NOTE — Telephone Encounter (Signed)
Patient contacted regarding discharge from [facility] hospital 12/17/20.  Patient understands to follow up with Gillian Shields NP 01/22/21 at 9:30 am at [facility] location. Patient understands discharge instructions. Patient understands medications and regiment. Patient understands to bring all medications to this visit.  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

## 2020-12-18 NOTE — Telephone Encounter (Signed)
#   1 TOC call to patient no answer.Left message to call back.

## 2020-12-19 ENCOUNTER — Telehealth (HOSPITAL_COMMUNITY): Payer: Self-pay

## 2020-12-19 NOTE — Telephone Encounter (Signed)
Spoke to patient Dr.Hochrein's advice given. 

## 2020-12-19 NOTE — Telephone Encounter (Signed)
Per the cardiac rehab phase I, fax cardiac rehab referral to [facility] cardiac rehab.

## 2020-12-25 ENCOUNTER — Telehealth: Payer: Self-pay | Admitting: Cardiology

## 2020-12-25 NOTE — Telephone Encounter (Signed)
Patient states he had a cath 9 days ago and would like to know when he is okay to swing a golf club.

## 2020-12-25 NOTE — Telephone Encounter (Signed)
Spoke with patient - advised to wait at least 2 weeks post-procedure before golfing again. He verbalized understanding

## 2020-12-26 ENCOUNTER — Ambulatory Visit: Payer: BLUE CROSS/BLUE SHIELD | Admitting: Internal Medicine

## 2020-12-31 ENCOUNTER — Ambulatory Visit: Payer: BC Managed Care – PPO | Admitting: Physician Assistant

## 2020-12-31 ENCOUNTER — Ambulatory Visit: Payer: BC Managed Care – PPO | Admitting: General Practice

## 2021-01-01 ENCOUNTER — Other Ambulatory Visit (HOSPITAL_COMMUNITY): Payer: Self-pay

## 2021-01-01 ENCOUNTER — Telehealth (HOSPITAL_COMMUNITY): Payer: Self-pay

## 2021-01-01 NOTE — Telephone Encounter (Signed)
Pharmacy Transitions of Care Follow-up Telephone Call  Date of discharge: 12/17/20  Discharge Diagnosis: STEMI  How have you been since you were released from the hospital?  Patient has been doing well since discharge, no questions about meds at this time  Medication changes made at discharge:      START taking: atorvastatin (LIPITOR)  Brilinta (ticagrelor)  metoprolol succinate (TOPROL-XL)  STOP taking: metoprolol tartrate 100 MG tablet (LOPRESSOR)  rosuvastatin 5 MG tablet (CRESTOR)   Medication changes verified by the patient? Yes    Medication Accessibility:  Home Pharmacy:  [facility]  Was the patient provided with refills on discharged medications? Yes   Have all prescriptions been transferred from [facility] to home pharmacy?  Yes Patient has [facility] [employer] insurance    Medication Review:  TICAGRELOR (BRILINTA) Ticagrelor 90 mg BID initiated on 12/17/20.  - Educated patient on expected duration of therapy of aspirin with ticagrelor.  - Discussed importance of taking medication around the same time every day, - Advised patient of medications to avoid (NSAIDs, aspirin maintenance doses>100 mg daily) - Educated that Tylenol (acetaminophen) will be the preferred analgesic to prevent risk of bleeding  - Emphasized importance of monitoring for signs and symptoms of bleeding (abnormal bruising, prolonged bleeding, nose bleeds, bleeding from gums, discolored urine, black tarry stools)  - Educated patient to notify doctor if shortness of breath or abnormal heartbeat occur - Advised patient to alert all providers of antiplatelet therapy prior to starting a new medication or having a procedure    Follow-up Appointments:  PCP Hospital f/u appt confirmed?  Not discussed  Specialist Hospital f/u appt confirmed?  Scheduled to see Dr. Dan Humphreys on 01/22/21 @ Cardiology.   If their condition worsens, is the pt aware to call PCP or go to the Emergency Dept.?  Yes  Final Patient Assessment: Patient has refills at home pharmacy and follow up scheduled

## 2021-01-08 DIAGNOSIS — M79672 Pain in left foot: Secondary | ICD-10-CM | POA: Diagnosis not present

## 2021-01-08 DIAGNOSIS — L4050 Arthropathic psoriasis, unspecified: Secondary | ICD-10-CM | POA: Diagnosis not present

## 2021-01-08 DIAGNOSIS — L089 Local infection of the skin and subcutaneous tissue, unspecified: Secondary | ICD-10-CM | POA: Diagnosis not present

## 2021-01-08 DIAGNOSIS — Z79899 Other long term (current) drug therapy: Secondary | ICD-10-CM | POA: Diagnosis not present

## 2021-01-22 ENCOUNTER — Ambulatory Visit (HOSPITAL_BASED_OUTPATIENT_CLINIC_OR_DEPARTMENT_OTHER): Payer: BC Managed Care – PPO | Admitting: Family

## 2021-02-27 ENCOUNTER — Encounter (HOSPITAL_BASED_OUTPATIENT_CLINIC_OR_DEPARTMENT_OTHER): Payer: Self-pay | Admitting: Family

## 2021-02-27 ENCOUNTER — Ambulatory Visit (INDEPENDENT_AMBULATORY_CARE_PROVIDER_SITE_OTHER): Payer: BC Managed Care – PPO | Admitting: Family

## 2021-02-27 ENCOUNTER — Other Ambulatory Visit: Payer: Self-pay

## 2021-02-27 VITALS — BP 106/62 | HR 66 | Ht 67.0 in | Wt 183.0 lb

## 2021-02-27 DIAGNOSIS — E785 Hyperlipidemia, unspecified: Secondary | ICD-10-CM

## 2021-02-27 DIAGNOSIS — I10 Essential (primary) hypertension: Secondary | ICD-10-CM

## 2021-02-27 DIAGNOSIS — I25118 Atherosclerotic heart disease of native coronary artery with other forms of angina pectoris: Secondary | ICD-10-CM

## 2021-02-27 MED ORDER — METOPROLOL SUCCINATE ER 50 MG PO TB24: 50.0000 mg | ORAL_TABLET | Freq: Every day | ORAL | 3 refills | Status: DC

## 2021-02-27 NOTE — Patient Instructions (Signed)
Medication Instructions:  Refill ordered *If you need a refill on your cardiac medications before your next appointment, please call your pharmacy*   Lab Work: Please report to the 3rd floor today for labs- CMP, Direct LDL, and CBC  If you have labs (blood work) drawn today and your tests are completely normal, you will receive your results only by: MyChart Message (if you have MyChart) OR A paper copy in the mail If you have any lab test that is abnormal or we need to change your treatment, we will call you to review the results.   Testing/Procedures: None today    Follow-Up: At [facility], you and your health needs are our priority.  As part of our continuing mission to provide you with exceptional heart care, we have created designated Provider Care Teams.  These Care Teams include your primary Cardiologist (physician) and Advanced Practice Providers (APPs -  Physician Assistants and Nurse Practitioners) who all work together to provide you with the care you need, when you need it.  We recommend signing up for the patient portal called "MyChart".  Sign up information is provided on this After Visit Summary.  MyChart is used to connect with patients for Virtual Visits (Telemedicine).  Patients are able to view lab/test results, encounter notes, upcoming appointments, etc.  Non-urgent messages can be sent to your provider as well.   To learn more about what you can do with MyChart, go to [email or web address].    Your next appointment:   3-4 month(s)  The format for your next appointment:   In Person  Provider:   Rollene Rotunda, MD   Other Instructions Please call our office if your systolic blood pressure (the top number) is consistently less than 100 or if you are lightheaded or dizzy.   For coronary artery disease often called "heart disease" we aim for optimal guideline directed medical therapy. We use the "A, B, C"s to help keep [place] on track!  A = Aspirin 81mg   daily B = Beta blocker which helps to relax the heart. This is your Metoprolol. C = Cholesterol control. You take Atorvastatin to help control your cholesterol.  D = Don't forget nitroglycerin! This is an emergency tablet to be used if you have chest pain. E = Extras. In your case, this is Brilinta for one year to protect your stent.

## 2021-02-27 NOTE — Progress Notes (Signed)
Office Visit    Patient Name: Kevin Walsh Date of Encounter: 03/01/2021  PCP:  Merlene Laughter, MD   [facility]  Cardiologist:  Rollene Rotunda, MD  Advanced Practice Provider:  No care team member to display Electrophysiologist:  None   Chief Complaint    Kevin Walsh is a 55 y.o. male with a hx of CAD with STEMI and DES to LAD 11/2020, HTN, HLD presents today for follow up of CAD   Past Medical History    Past Medical History:  Diagnosis Date   Allergic rhinitis, cause unspecified    Dyslipidemia    Hypertension    Palpitations    Psoriatic arthritis (HCC)    Past Surgical History:  Procedure Laterality Date   CORONARY/GRAFT ACUTE MI REVASCULARIZATION N/A 12/15/2020   Procedure: Coronary/Graft Acute MI Revascularization;  Surgeon: Swaziland, Peter M, MD;  Location: [facility] INVASIVE CV LAB;  Service: Cardiovascular;  Laterality: N/A;   LEFT HEART CATH AND CORONARY ANGIOGRAPHY N/A 12/15/2020   Procedure: LEFT HEART CATH AND CORONARY ANGIOGRAPHY;  Surgeon: Swaziland, Peter M, MD;  Location: [facility] INVASIVE CV LAB;  Service: Cardiovascular;  Laterality: N/A;   None      Allergies  No Known Allergies  History of Present Illness    Kevin Walsh is a 55 y.o. male with a hx of CAD s/p STEMI with DES to LAD 11/2020, HTN, HLD last seen while hospitalized.  Admitted 12/15/20 for STEMI after presenting with chest pain. Seen in clinic the day prior by Dr. Antoine Poche and set up for CTA as he had two episodes of prior chest discomfort while playing golf and EKG was normal. Morning of admission he was playing golf with recurrent chest pain much worse than prior. Underwent cardiac cath showing single vessel obstructive CAD of 99% thrombotic lesion in LAD treated with PCI/DES x1 along with embolization to the far distal apical LAD. Echo with LVEF 55-60% and akinesis of LV and entire apical segment.   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  Reports feeling well since discharge. Does lots of walking at his job with no exertional chest pain nor dyspnea. Reports following a heart healthy diet. Hospital records and medications reviewed in detail. Reports no shortness of breath nor dyspnea on exertion. Reports no chest pain, pressure, or tightness. No edema, orthopnea, PND. Reports no palpitations.    EKGs/Labs/Other Studies Reviewed:   The following studies were reviewed today: Cath: 12/15/20: p-m LAD 99% thrombotic lesion & diatl LAD 100% (distal embolization); LAD DES PCI - STENT ONYX FRONTIER 3.0X15.  Good overall LV function with apical HK.  Normal LVEDP.  Prox LCx 25%.   Plan: DAPT for one year. Risk factor modification. May be a candidate for fast track discharge.    Diagnostic: Dominance: Right                                                              Intervention Echo: 12/16/20: EF 55-60%. Normal Fxn. Apical Akinesis. Unable to assess RVP. Normal MV & AoV.   EKG:  EKG is ordered today.  The ekg ordered today demonstrates NSR 66 bpm with short PR 108 ms. TWI in inferior leads have resolved. Stable TWI in lateral leads.  Recent Labs:  12/15/2020: Magnesium 1.9 02/27/2021: ALT 22; BUN 12; Creatinine, Ser 1.28; Hemoglobin 13.6; Platelets 268; Potassium 4.4; Sodium 143  Recent Lipid Panel    Component Value Date/Time   CHOL 101 12/16/2020 0438   TRIG 100 12/16/2020 0438   HDL 26 (L) 12/16/2020 0438   CHOLHDL 3.9 12/16/2020 0438   VLDL 20 12/16/2020 0438   LDLCALC 55 12/16/2020 0438   LDLDIRECT 53 02/27/2021 1610    Home Medications   Current Meds  Medication Sig   ASPIRIN LOW DOSE 81 MG EC tablet Take 81 mg by mouth daily.   atorvastatin (LIPITOR) 80 MG tablet Take 1 tablet (80 mg total) by mouth daily at 8 pm.   Ixekizumab (TALTZ) 80 MG/ML SOAJ Inject 80 mg into the skin every 30 (thirty) days.   lisinopril (ZESTRIL) 10 MG tablet Take 10 mg by mouth daily.   nitroGLYCERIN (NITROSTAT) 0.4 MG SL tablet Place 0.4 mg under  the tongue every 5 (five) minutes as needed for chest pain.   predniSONE (DELTASONE) 5 MG tablet Take 5 mg by mouth daily with breakfast.   ticagrelor (BRILINTA) 90 MG TABS tablet Take 1 tablet (90 mg total) by mouth 2 (two) times daily.   [DISCONTINUED] metoprolol succinate (TOPROL-XL) 50 MG 24 hr tablet Take 1 tablet (50 mg total) by mouth daily. Take with or immediately following a meal.     Review of Systems      All other systems reviewed and are otherwise negative except as noted above.  Physical Exam    VS:  BP 106/62 (BP Location: Left Arm, Patient Position: Sitting)   Pulse 66   Ht 5\' 7"  (1.702 m)   Wt 183 lb (83 kg)   BMI 28.66 kg/m  , BMI Body mass index is 28.66 kg/m.  Wt Readings from Last 3 Encounters:  02/27/21 183 lb (83 kg)  12/17/20 185 lb 9.6 oz (84.2 kg)  12/14/20 185 lb 9.6 oz (84.2 kg)     GEN: Well nourished, well developed, in no acute distress. HEENT: normal. Neck: Supple, no JVD, carotid bruits, or masses. Cardiac: RRR, no murmurs, rubs, or gallops. No clubbing, cyanosis, edema.  Radials/PT 2+ and equal bilaterally.  Respiratory:  Respirations regular and unlabored, clear to auscultation bilaterally. GI: Soft, nontender, nondistended. MS: No deformity or atrophy. Skin: Warm and dry, no rash. Neuro:  Strength and sensation are intact. Psych: Normal affect.  Assessment & Plan    CAD - s/p STEMI and DES to LAD 11/2020. Stable with no anginal symptoms. No indication for ischemic evaluation.  GDMT includes DAPT x 1 year with aspirin/Brilinta, metoprolol, atorvastatin. Refill sprovided. Heart healthy diet and regular cardiovascular exercise encouraged.    HTN - BP well controlled. Continue current antihypertensive regimen.    HLD - Continue Atorvastatin 80mg  QD. Will repeat CMP, lipid panel today.   Disposition: Follow up in 3 month(s) with 12/2020, MD or APP.  Signed, , NP 03/01/2021, 6:14 PM [facility]

## 2021-02-28 LAB — CBC
Hematocrit: 40.9 % (ref 37.5–51.0)
Hemoglobin: 13.6 g/dL (ref 13.0–17.7)
MCH: 29.9 pg (ref 26.6–33.0)
MCHC: 33.3 g/dL (ref 31.5–35.7)
MCV: 90 fL (ref 79–97)
Platelets: 268 10*3/uL (ref 150–450)
RBC: 4.55 x10E6/uL (ref 4.14–5.80)
RDW: 13.9 % (ref 11.6–15.4)
WBC: 9.6 10*3/uL (ref 3.4–10.8)

## 2021-02-28 LAB — COMPREHENSIVE METABOLIC PANEL
ALT: 22 IU/L (ref 0–44)
AST: 18 IU/L (ref 0–40)
Albumin/Globulin Ratio: 1.9 (ref 1.2–2.2)
Albumin: 4.2 g/dL (ref 3.8–4.9)
Alkaline Phosphatase: 106 IU/L (ref 44–121)
BUN/Creatinine Ratio: 9 (ref 9–20)
BUN: 12 mg/dL (ref 6–24)
Bilirubin Total: 1 mg/dL (ref 0.0–1.2)
CO2: 26 mmol/L (ref 20–29)
Calcium: 9.1 mg/dL (ref 8.7–10.2)
Chloride: 104 mmol/L (ref 96–106)
Creatinine, Ser: 1.28 mg/dL — ABNORMAL HIGH (ref 0.76–1.27)
Globulin, Total: 2.2 g/dL (ref 1.5–4.5)
Glucose: 80 mg/dL (ref 70–99)
Potassium: 4.4 mmol/L (ref 3.5–5.2)
Sodium: 143 mmol/L (ref 134–144)
Total Protein: 6.4 g/dL (ref 6.0–8.5)
eGFR: 67 mL/min/{1.73_m2} (ref >59)

## 2021-02-28 LAB — LDL CHOLESTEROL, DIRECT: LDL Direct: 53 mg/dL (ref 0–99)

## 2021-03-01 ENCOUNTER — Encounter (HOSPITAL_BASED_OUTPATIENT_CLINIC_OR_DEPARTMENT_OTHER): Payer: Self-pay | Admitting: Family

## 2021-03-07 ENCOUNTER — Telehealth: Payer: Self-pay | Admitting: Cardiology

## 2021-03-07 NOTE — Telephone Encounter (Signed)
*  STAT* If patient is at the pharmacy, call can be transferred to refill team.   1. Which medications need to be refilled? (please list name of each medication and dose if known) atorvastatin (LIPITOR) 80 MG tablet  2. Which pharmacy/location (including street and city if local pharmacy) is medication to be sent to? [id number] - [place], [place] - [address] AT [facility] [address]  3. Do they need a 30 day or 90 day supply? 90 day

## 2021-03-28 DIAGNOSIS — M79672 Pain in left foot: Secondary | ICD-10-CM | POA: Diagnosis not present

## 2021-03-28 DIAGNOSIS — Z79899 Other long term (current) drug therapy: Secondary | ICD-10-CM | POA: Diagnosis not present

## 2021-03-28 DIAGNOSIS — L409 Psoriasis, unspecified: Secondary | ICD-10-CM | POA: Diagnosis not present

## 2021-03-28 DIAGNOSIS — L4050 Arthropathic psoriasis, unspecified: Secondary | ICD-10-CM | POA: Diagnosis not present

## 2021-03-28 DIAGNOSIS — M79675 Pain in left toe(s): Secondary | ICD-10-CM | POA: Diagnosis not present

## 2021-04-05 ENCOUNTER — Other Ambulatory Visit: Payer: Self-pay | Admitting: Cardiology

## 2021-05-29 DIAGNOSIS — I25118 Atherosclerotic heart disease of native coronary artery with other forms of angina pectoris: Secondary | ICD-10-CM | POA: Insufficient documentation

## 2021-05-29 NOTE — Progress Notes (Signed)
Cardiology Office Note   Date:  05/30/2021   ID:  Kevin Walsh, Kevin Walsh 1965/09/07, MRN [id number]  PCP:  Merlene Laughter, MD  Cardiologist:   Rollene Rotunda, MD Referring:  Merlene Laughter, MD  Chief Complaint  Patient presents with   Coronary Artery Disease       History of Present Illness: Kevin Walsh is a 56 y.o. male who presents for evaluation of chest pain.   He was admitted in August 2022.  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 Echo with LVEF 55-60% and akinesis of LV and entire apical segment.   Since he was last seen he has done very well.  He is walking a lot around his [employer].  He does a lot of golfing. The patient denies any new symptoms such as chest discomfort, neck or arm discomfort. There has been no new shortness of breath, PND or orthopnea. There have been no reported palpitations, presyncope or syncope.  He has not required any nitrates.   Past Medical History:  Diagnosis Date   Allergic rhinitis, cause unspecified    Dyslipidemia    Hypertension    Palpitations    Psoriatic arthritis (HCC)     Past Surgical History:  Procedure Laterality Date   CORONARY/GRAFT ACUTE MI REVASCULARIZATION N/A 12/15/2020   Procedure: Coronary/Graft Acute MI Revascularization;  Surgeon: Swaziland, Peter M, MD;  Location: [facility] INVASIVE CV LAB;  Service: Cardiovascular;  Laterality: N/A;   LEFT HEART CATH AND CORONARY ANGIOGRAPHY N/A 12/15/2020   Procedure: LEFT HEART CATH AND CORONARY ANGIOGRAPHY;  Surgeon: Swaziland, Peter M, MD;  Location: [facility] INVASIVE CV LAB;  Service: Cardiovascular;  Laterality: N/A;   None       Current Outpatient Medications  Medication Sig Dispense Refill   ASPIRIN LOW DOSE 81 MG EC tablet Take 81 mg by mouth daily.     Ixekizumab (TALTZ) 80 MG/ML SOAJ Inject 80 mg into the skin every 30 (thirty) days.      lisinopril (ZESTRIL) 10 MG tablet Take 10 mg by mouth daily.     metoprolol succinate (TOPROL-XL) 50 MG 24 hr tablet Take 1 tablet (50 mg total) by mouth daily. Take with or immediately following a meal. 90 tablet 3   nitroGLYCERIN (NITROSTAT) 0.4 MG SL tablet Place 0.4 mg under the tongue every 5 (five) minutes as needed for chest pain.     predniSONE (DELTASONE) 5 MG tablet Take 5 mg by mouth daily with breakfast.     ticagrelor (BRILINTA) 90 MG TABS tablet TAKE 1 TABLET BY MOUTH TWICE DAILY 180 tablet 3   atorvastatin (LIPITOR) 80 MG tablet Take 1 tablet (80 mg total) by mouth daily at 8 pm. 90 tablet 1   No current facility-administered medications for this visit.    Allergies:   Patient has no known allergies.    Social History:  The patient  reports that he has never smoked. He has never used smokeless tobacco.   Family History:  The patient's family history includes Congestive Heart Failure in his mother; Hypertension in his mother.    ROS:  Please see the history of present illness.   Otherwise, review of systems are positive for none.   All other systems are reviewed and negative.    PHYSICAL EXAM: VS:  BP 116/72    Pulse 92    Ht 5\' 7"  (  1.702 m)    Wt 190 lb 3.2 oz (86.3 kg)    SpO2 99%    BMI 29.79 kg/m  , BMI Body mass index is 29.79 kg/m. GENERAL:  Well appearing HEENT:  Pupils equal round and reactive, fundi not visualized, oral mucosa unremarkable NECK:  No jugular venous di2stention, waveform within normal limits, carotid upstroke brisk and symmetric, no bruits, no thyromegaly LYMPHATICS:  No cervical, inguinal adenopathy LUNGS:  Clear to auscultation bilaterally BACK:  No CVA tenderness CHEST:  Unremarkable HEART:  PMI not displaced or sustained,S1 and S2 within normal limits, no S3, no S4, no clicks, no rubs, no murmurs ABD:  Flat, positive bowel sounds normal in frequency in pitch, no bruits, no rebound, no guarding, no midline pulsatile mass, no hepatomegaly, no  splenomegaly EXT:  2 plus pulses throughout, no edema, no cyanosis no clubbing SKIN:  No rashes no nodules NEURO:  Cranial nerves II through XII grossly intact, motor grossly intact throughout PSYCH:  Cognitively intact, oriented to person place and time    EKG:  EKG is ordered today. The ekg ordered today demonstrates sinus tachycardia, rate 115, axis within normal limits, intervals within normal limits, no acute ST-T wave changes   Recent Labs: 12/15/2020: Magnesium 1.9 02/27/2021: ALT 22; BUN 12; Creatinine, Ser 1.28; Hemoglobin 13.6; Platelets 268; Potassium 4.4; Sodium 143    Lipid Panel    Component Value Date/Time   CHOL 101 12/16/2020 0438   TRIG 100 12/16/2020 0438   HDL 26 (L) 12/16/2020 0438   CHOLHDL 3.9 12/16/2020 0438   VLDL 20 12/16/2020 0438   LDLCALC 55 12/16/2020 0438   LDLDIRECT 53 02/27/2021 1610      Wt Readings from Last 3 Encounters:  05/30/21 190 lb 3.2 oz (86.3 kg)  02/27/21 183 lb (83 kg)  12/17/20 185 lb 9.6 oz (84.2 kg)     Cardiac Cath.  Cath: 12/15/20: p-m LAD 99% thrombotic lesion & diatl LAD 100% (distal embolization); LAD DES PCI - STENT ONYX FRONTIER 3.0X15.  Good overall LV function with apical HK.  Normal LVEDP.  Prox LCx 25%.   Plan: DAPT for one year. Risk factor modification. May be a candidate for fast track discharge.    Diagnostic: Dominance: Right                                                              Intervention Echo: 12/16/20: EF 55-60%. Normal Fxn. Apical Akinesis. Unable to assess RVP. Normal MV & AoV.       Other studies Reviewed: Additional studies/ records that were reviewed today include: Labs. Review of the above records demonstrates:  Please see elsewhere in the note.     ASSESSMENT AND PLAN:  CAD - s/p STEMI and DES to LAD 11/2020 : We will continue with dual antiplatelet therapy and I will see him in August and consider discontinuing Brilinta.  We are can continuing with secondary risk reduction.   HTN :  Blood pressure is controlled.  No change in therapy.   HLD : I will have him come back for fasting lipid profile.   Current medicines are reviewed at length with the patient today.  The patient does not have concerns regarding medicines.  The following changes have been made: None   Labs/ tests  ordered today include: None  Orders Placed This Encounter  Procedures   Lipid panel   Hepatic function panel      Disposition:   FU with me August   Signed, Rollene Rotunda, MD  05/30/2021 5:06 PM    [facility]

## 2021-05-30 ENCOUNTER — Encounter: Payer: Self-pay | Admitting: Cardiology

## 2021-05-30 ENCOUNTER — Other Ambulatory Visit: Payer: Self-pay

## 2021-05-30 ENCOUNTER — Ambulatory Visit: Payer: BC Managed Care – PPO | Admitting: Cardiology

## 2021-05-30 VITALS — BP 116/72 | HR 92 | Ht 67.0 in | Wt 190.2 lb

## 2021-05-30 DIAGNOSIS — E785 Hyperlipidemia, unspecified: Secondary | ICD-10-CM

## 2021-05-30 DIAGNOSIS — I10 Essential (primary) hypertension: Secondary | ICD-10-CM | POA: Diagnosis not present

## 2021-05-30 DIAGNOSIS — I25118 Atherosclerotic heart disease of native coronary artery with other forms of angina pectoris: Secondary | ICD-10-CM

## 2021-05-30 MED ORDER — ATORVASTATIN CALCIUM 80 MG PO TABS: 80.0000 mg | ORAL_TABLET | Freq: Every day | ORAL | 1 refills | Status: DC

## 2021-05-30 NOTE — Patient Instructions (Addendum)
Your physician recommends that you return for lab work FASTING   Follow-Up: At [facility], you and your health needs are our priority.  As part of our continuing mission to provide you with exceptional heart care, we have created designated Provider Care Teams.  These Care Teams include your primary Cardiologist (physician) and Advanced Practice Providers (APPs -  Physician Assistants and Nurse Practitioners) who all work together to provide you with the care you need, when you need it.  We recommend signing up for the patient portal called "MyChart".  Sign up information is provided on this After Visit Summary.  MyChart is used to connect with patients for Virtual Visits (Telemedicine).  Patients are able to view lab/test results, encounter notes, upcoming appointments, etc.  Non-urgent messages can be sent to your provider as well.   To learn more about what you can do with MyChart, go to [email or web address].    Your next appointment:   6 month(s)  The format for your next appointment:   In Person  Provider:   Minus Breeding, MD

## 2021-06-26 DIAGNOSIS — Z79899 Other long term (current) drug therapy: Secondary | ICD-10-CM | POA: Diagnosis not present

## 2021-06-26 DIAGNOSIS — M79672 Pain in left foot: Secondary | ICD-10-CM | POA: Diagnosis not present

## 2021-06-26 DIAGNOSIS — L409 Psoriasis, unspecified: Secondary | ICD-10-CM | POA: Diagnosis not present

## 2021-06-26 DIAGNOSIS — L4050 Arthropathic psoriasis, unspecified: Secondary | ICD-10-CM | POA: Diagnosis not present

## 2021-07-08 ENCOUNTER — Encounter: Payer: Self-pay | Admitting: *Deleted

## 2021-07-12 DIAGNOSIS — D229 Melanocytic nevi, unspecified: Secondary | ICD-10-CM | POA: Diagnosis not present

## 2021-07-12 DIAGNOSIS — L409 Psoriasis, unspecified: Secondary | ICD-10-CM | POA: Diagnosis not present

## 2021-07-12 DIAGNOSIS — L821 Other seborrheic keratosis: Secondary | ICD-10-CM | POA: Diagnosis not present

## 2021-07-12 DIAGNOSIS — L570 Actinic keratosis: Secondary | ICD-10-CM | POA: Diagnosis not present

## 2021-08-06 DIAGNOSIS — E785 Hyperlipidemia, unspecified: Secondary | ICD-10-CM | POA: Diagnosis not present

## 2021-08-06 LAB — HEPATIC FUNCTION PANEL
ALT: 23 IU/L (ref 0–44)
AST: 20 IU/L (ref 0–40)
Albumin: 4 g/dL (ref 3.8–4.9)
Alkaline Phosphatase: 121 IU/L (ref 44–121)
Bilirubin Total: 0.7 mg/dL (ref 0.0–1.2)
Bilirubin, Direct: 0.19 mg/dL (ref 0.00–0.40)
Total Protein: 6.3 g/dL (ref 6.0–8.5)

## 2021-08-06 LAB — LIPID PANEL
Chol/HDL Ratio: 3.1 ratio (ref 0.0–5.0)
Cholesterol, Total: 107 mg/dL (ref 100–199)
HDL: 34 mg/dL — ABNORMAL LOW (ref >39)
LDL Chol Calc (NIH): 60 mg/dL (ref 0–99)
Triglycerides: 58 mg/dL (ref 0–149)
VLDL Cholesterol Cal: 13 mg/dL (ref 5–40)

## 2021-08-15 ENCOUNTER — Encounter: Payer: Self-pay | Admitting: *Deleted

## 2021-08-15 DIAGNOSIS — R6882 Decreased libido: Secondary | ICD-10-CM | POA: Diagnosis not present

## 2021-08-22 DIAGNOSIS — E291 Testicular hypofunction: Secondary | ICD-10-CM | POA: Diagnosis not present

## 2021-08-22 DIAGNOSIS — R7989 Other specified abnormal findings of blood chemistry: Secondary | ICD-10-CM | POA: Diagnosis not present

## 2021-09-05 DIAGNOSIS — Z79899 Other long term (current) drug therapy: Secondary | ICD-10-CM | POA: Diagnosis not present

## 2021-09-05 DIAGNOSIS — M79672 Pain in left foot: Secondary | ICD-10-CM | POA: Diagnosis not present

## 2021-09-05 DIAGNOSIS — L409 Psoriasis, unspecified: Secondary | ICD-10-CM | POA: Diagnosis not present

## 2021-09-05 DIAGNOSIS — L4050 Arthropathic psoriasis, unspecified: Secondary | ICD-10-CM | POA: Diagnosis not present

## 2021-11-01 DIAGNOSIS — M5432 Sciatica, left side: Secondary | ICD-10-CM | POA: Diagnosis not present

## 2021-11-11 ENCOUNTER — Other Ambulatory Visit: Payer: Self-pay | Admitting: Neurosurgery

## 2021-11-11 DIAGNOSIS — M5432 Sciatica, left side: Secondary | ICD-10-CM

## 2021-11-18 ENCOUNTER — Ambulatory Visit
Admission: RE | Admit: 2021-11-18 | Discharge: 2021-11-18 | Disposition: A | Discharge status: Home / Self Care | Payer: BC Managed Care – PPO | Source: Ambulatory Visit | Attending: Neurosurgery | Admitting: Neurosurgery

## 2021-11-18 DIAGNOSIS — M5432 Sciatica, left side: Secondary | ICD-10-CM

## 2021-11-24 NOTE — Progress Notes (Signed)
Cardiology Office Note   Date:  11/25/2021   ID:  Kevin Walsh, Kevin Walsh [birth year], MRN [id number]  PCP:  Merlene Laughter, MD  Cardiologist:   Rollene Rotunda, MD Referring:  Merlene Laughter, MD    Chief Complaint  Patient presents with   Coronary Artery Disease      History of Present Illness: Kevin Walsh is a 56 y.o. male who presents for evaluation of chest pain.   He was admitted in August 2022.  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 Echo with LVEF 55-60% and akinesis of LV and entire apical segment.   Since he was last seen he has had no new cardiovascular complaints.  He walks a lot on his job at a [employer].  The patient denies any new symptoms such as chest discomfort, neck or arm discomfort. There has been no new shortness of breath, PND or orthopnea. There have been no reported palpitations, presyncope or syncope.     Past Medical History:  Diagnosis Date   Allergic rhinitis, cause unspecified    Dyslipidemia    Hypertension    Palpitations    Psoriatic arthritis (HCC)     Past Surgical History:  Procedure Laterality Date   CORONARY/GRAFT ACUTE MI REVASCULARIZATION N/A 12/15/2020   Procedure: Coronary/Graft Acute MI Revascularization;  Surgeon: Swaziland, Peter M, MD;  Location: [facility] INVASIVE CV LAB;  Service: Cardiovascular;  Laterality: N/A;   LEFT HEART CATH AND CORONARY ANGIOGRAPHY N/A 12/15/2020   Procedure: LEFT HEART CATH AND CORONARY ANGIOGRAPHY;  Surgeon: Swaziland, Peter M, MD;  Location: [facility] INVASIVE CV LAB;  Service: Cardiovascular;  Laterality: N/A;   None       Current Outpatient Medications  Medication Sig Dispense Refill   ASPIRIN LOW DOSE 81 MG EC tablet Take 81 mg by mouth daily.     atorvastatin (LIPITOR) 80 MG tablet Take 1 tablet (80 mg total) by mouth daily at 8 pm. 90 tablet 1   gabapentin (NEURONTIN)  100 MG capsule Take by mouth.     Ixekizumab (TALTZ) 80 MG/ML SOAJ Inject 80 mg into the skin every 30 (thirty) days.     lisinopril (ZESTRIL) 10 MG tablet Take 10 mg by mouth daily.     metoprolol succinate (TOPROL-XL) 50 MG 24 hr tablet Take 1 tablet (50 mg total) by mouth daily. Take with or immediately following a meal. 90 tablet 3   nitroGLYCERIN (NITROSTAT) 0.4 MG SL tablet Place 0.4 mg under the tongue every 5 (five) minutes as needed for chest pain.     predniSONE (DELTASONE) 5 MG tablet Take 5 mg by mouth daily with breakfast.     No current facility-administered medications for this visit.    Allergies:   Patient has no known allergies.    ROS:  Please see the history of present illness.   Otherwise, review of systems are positive for none.   All other systems are reviewed and negative.    PHYSICAL EXAM: VS:  BP 118/88   Pulse 79   Ht 5\' 8"  (1.727 m)   Wt 191 lb 3.2 oz (86.7 kg)   SpO2 95%   BMI 29.07 kg/m  , BMI Body mass index is 29.07 kg/m. GENERAL:  Well appearing NECK:  No jugular venous distention, waveform within normal limits, carotid upstroke brisk and symmetric, no bruits, no thyromegaly LUNGS:  Clear  to auscultation bilaterally CHEST:  Unremarkable HEART:  PMI not displaced or sustained,S1 and S2 within normal limits, no S3, no S4, no clicks, no rubs, no murmurs ABD:  Flat, positive bowel sounds normal in frequency in pitch, no bruits, no rebound, no guarding, no midline pulsatile mass, no hepatomegaly, no splenomegaly EXT:  2 plus pulses throughout, no edema, no cyanosis no clubbing    EKG:  EKG is  ordered today. The ekg ordered today demonstrates sinus tachycardia, rate 79, axis within normal limits, intervals within normal limits, no acute ST-T wave changes   Recent Labs: 12/15/2020: Magnesium 1.9 02/27/2021: BUN 12; Creatinine, Ser 1.28; Hemoglobin 13.6; Platelets 268; Potassium 4.4; Sodium 143 08/06/2021: ALT 23    Lipid Panel    Component Value  Date/Time   CHOL 107 08/06/2021 0813   TRIG 58 08/06/2021 0813   HDL 34 (L) 08/06/2021 0813   CHOLHDL 3.1 08/06/2021 0813   CHOLHDL 3.9 12/16/2020 0438   VLDL 20 12/16/2020 0438   LDLCALC 60 08/06/2021 0813   LDLDIRECT 53 02/27/2021 1610      Wt Readings from Last 3 Encounters:  11/25/21 191 lb 3.2 oz (86.7 kg)  05/30/21 190 lb 3.2 oz (86.3 kg)  02/27/21 183 lb (83 kg)     Cardiac Cath.  Cath: 12/15/20: p-m LAD 99% thrombotic lesion & diatl LAD 100% (distal embolization); LAD DES PCI - STENT ONYX FRONTIER 3.0X15.  Good overall LV function with apical HK.  Normal LVEDP.  Prox LCx 25%.   Plan: DAPT for one year. Risk factor modification. May be a candidate for fast track discharge.    Diagnostic: Dominance: Right                                                              Intervention Echo: 12/16/20: EF 55-60%. Normal Fxn. Apical Akinesis. Unable to assess RVP. Normal MV & AoV.       Other studies Reviewed: Additional studies/ records that were reviewed today include: Labs. Review of the above records demonstrates:  Please see elsewhere in the note.     ASSESSMENT AND PLAN:  CAD -    The patient has no new sypmtoms.  No further cardiovascular testing is indicated.  He can stop his Brilinta and continue other medicines.  HTN : Blood pressure is well controlled.  No change in therapy.  HLD :   He had an excellent lipid profile in April with an LDL of 68 and HDL of 34.   Current medicines are reviewed at length with the patient today.  The patient does not have concerns regarding medicines.  The following changes have been made: None   Labs/ tests ordered today include: None   Orders Placed This Encounter  Procedures   EKG 12-Lead    Disposition:   FU with me in one year.    Signed, Rollene Rotunda, MD  11/25/2021 12:27 PM    [facility]

## 2021-11-25 ENCOUNTER — Encounter: Payer: Self-pay | Admitting: Cardiology

## 2021-11-25 ENCOUNTER — Ambulatory Visit (INDEPENDENT_AMBULATORY_CARE_PROVIDER_SITE_OTHER): Payer: BC Managed Care – PPO | Admitting: Cardiology

## 2021-11-25 VITALS — BP 118/88 | HR 79 | Ht 68.0 in | Wt 191.2 lb

## 2021-11-25 DIAGNOSIS — I25118 Atherosclerotic heart disease of native coronary artery with other forms of angina pectoris: Secondary | ICD-10-CM | POA: Diagnosis not present

## 2021-11-25 DIAGNOSIS — I10 Essential (primary) hypertension: Secondary | ICD-10-CM

## 2021-11-25 DIAGNOSIS — E785 Hyperlipidemia, unspecified: Secondary | ICD-10-CM | POA: Diagnosis not present

## 2021-11-25 NOTE — Patient Instructions (Signed)
Medication Instructions:   STOP BRILINTA  *If you need a refill on your cardiac medications before your next appointment, please call your pharmacy*   Follow-Up: At [facility], you and your health needs are our priority.  As part of our continuing mission to provide you with exceptional heart care, we have created designated Provider Care Teams.  These Care Teams include your primary Cardiologist (physician) and Advanced Practice Providers (APPs -  Physician Assistants and Nurse Practitioners) who all work together to provide you with the care you need, when you need it.  We recommend signing up for the patient portal called "MyChart".  Sign up information is provided on this After Visit Summary.  MyChart is used to connect with patients for Virtual Visits (Telemedicine).  Patients are able to view lab/test results, encounter notes, upcoming appointments, etc.  Non-urgent messages can be sent to your provider as well.   To learn more about what you can do with MyChart, go to [email or web address].    Your next appointment:   12 month(s)  The format for your next appointment:   In Person  Provider:   Rollene Rotunda, MD

## 2021-11-26 ENCOUNTER — Other Ambulatory Visit: Payer: Self-pay | Admitting: Cardiology

## 2021-11-27 DIAGNOSIS — R635 Abnormal weight gain: Secondary | ICD-10-CM | POA: Diagnosis not present

## 2021-11-27 DIAGNOSIS — Z125 Encounter for screening for malignant neoplasm of prostate: Secondary | ICD-10-CM | POA: Diagnosis not present

## 2021-11-27 DIAGNOSIS — F40240 Claustrophobia: Secondary | ICD-10-CM | POA: Diagnosis not present

## 2021-11-27 DIAGNOSIS — R5383 Other fatigue: Secondary | ICD-10-CM | POA: Diagnosis not present

## 2021-11-27 DIAGNOSIS — E230 Hypopituitarism: Secondary | ICD-10-CM | POA: Diagnosis not present

## 2021-11-28 DIAGNOSIS — M48061 Spinal stenosis, lumbar region without neurogenic claudication: Secondary | ICD-10-CM | POA: Diagnosis not present

## 2021-11-28 DIAGNOSIS — M5116 Intervertebral disc disorders with radiculopathy, lumbar region: Secondary | ICD-10-CM | POA: Diagnosis not present

## 2021-11-28 DIAGNOSIS — M4726 Other spondylosis with radiculopathy, lumbar region: Secondary | ICD-10-CM | POA: Diagnosis not present

## 2021-12-03 ENCOUNTER — Other Ambulatory Visit: Payer: Self-pay | Admitting: Internal Medicine

## 2021-12-03 DIAGNOSIS — E230 Hypopituitarism: Secondary | ICD-10-CM

## 2022-01-15 DIAGNOSIS — Z79899 Other long term (current) drug therapy: Secondary | ICD-10-CM | POA: Diagnosis not present

## 2022-02-07 DIAGNOSIS — E230 Hypopituitarism: Secondary | ICD-10-CM | POA: Diagnosis not present

## 2022-02-14 DIAGNOSIS — E230 Hypopituitarism: Secondary | ICD-10-CM | POA: Diagnosis not present

## 2022-02-14 DIAGNOSIS — R5383 Other fatigue: Secondary | ICD-10-CM | POA: Diagnosis not present

## 2022-02-25 DIAGNOSIS — M5136 Other intervertebral disc degeneration, lumbar region: Secondary | ICD-10-CM | POA: Diagnosis not present

## 2022-03-03 ENCOUNTER — Other Ambulatory Visit (HOSPITAL_BASED_OUTPATIENT_CLINIC_OR_DEPARTMENT_OTHER): Payer: Self-pay

## 2022-03-03 MED ORDER — METOPROLOL SUCCINATE ER 50 MG PO TB24: 50.0000 mg | ORAL_TABLET | Freq: Every day | ORAL | 3 refills | Status: DC

## 2022-03-03 NOTE — Telephone Encounter (Signed)
Received fax from [facility] requesting refills for Metoprolol Succ. 50 mg.   Routing to NL refills--Pt of Dr. Antoine Poche.

## 2022-03-07 DIAGNOSIS — Z79899 Other long term (current) drug therapy: Secondary | ICD-10-CM | POA: Diagnosis not present

## 2022-03-07 DIAGNOSIS — E230 Hypopituitarism: Secondary | ICD-10-CM | POA: Diagnosis not present

## 2022-03-18 ENCOUNTER — Telehealth: Payer: Self-pay | Admitting: Cardiology

## 2022-03-18 NOTE — Telephone Encounter (Signed)
*  STAT* If patient is at the pharmacy, call can be transferred to refill team.   1. Which medications need to be refilled? (please list name of each medication and dose if known)   nitroGLYCERIN (NITROSTAT) 0.4 MG SL tablet    2. Which pharmacy/location (including street and city if local pharmacy) is medication to be sent to?  [id number] - [place], [place] - [address] AT [facility] [address]    3. Do they need a 30 day or 90 day supply? 90 day supply     Prescription has expired.

## 2022-03-19 MED ORDER — NITROGLYCERIN 0.4 MG SL SUBL: 0.4000 mg | SUBLINGUAL_TABLET | SUBLINGUAL | 3 refills | Status: AC | PRN

## 2022-04-02 DIAGNOSIS — M79675 Pain in left toe(s): Secondary | ICD-10-CM | POA: Diagnosis not present

## 2022-04-02 DIAGNOSIS — M79672 Pain in left foot: Secondary | ICD-10-CM | POA: Diagnosis not present

## 2022-04-02 DIAGNOSIS — Z79899 Other long term (current) drug therapy: Secondary | ICD-10-CM | POA: Diagnosis not present

## 2022-04-02 DIAGNOSIS — Z7952 Long term (current) use of systemic steroids: Secondary | ICD-10-CM | POA: Diagnosis not present

## 2022-04-02 DIAGNOSIS — L4050 Arthropathic psoriasis, unspecified: Secondary | ICD-10-CM | POA: Diagnosis not present

## 2022-04-03 DIAGNOSIS — E230 Hypopituitarism: Secondary | ICD-10-CM | POA: Diagnosis not present

## 2022-04-03 DIAGNOSIS — Z79899 Other long term (current) drug therapy: Secondary | ICD-10-CM | POA: Diagnosis not present

## 2022-10-21 DIAGNOSIS — D229 Melanocytic nevi, unspecified: Secondary | ICD-10-CM | POA: Diagnosis not present

## 2022-10-21 DIAGNOSIS — L814 Other melanin hyperpigmentation: Secondary | ICD-10-CM | POA: Diagnosis not present

## 2022-10-21 DIAGNOSIS — L578 Other skin changes due to chronic exposure to nonionizing radiation: Secondary | ICD-10-CM | POA: Diagnosis not present

## 2022-10-21 DIAGNOSIS — L821 Other seborrheic keratosis: Secondary | ICD-10-CM | POA: Diagnosis not present

## 2022-10-21 DIAGNOSIS — L570 Actinic keratosis: Secondary | ICD-10-CM | POA: Diagnosis not present

## 2022-11-14 DIAGNOSIS — Z125 Encounter for screening for malignant neoplasm of prostate: Secondary | ICD-10-CM | POA: Diagnosis not present

## 2022-11-14 DIAGNOSIS — E230 Hypopituitarism: Secondary | ICD-10-CM | POA: Diagnosis not present

## 2022-11-14 DIAGNOSIS — E663 Overweight: Secondary | ICD-10-CM | POA: Diagnosis not present

## 2022-11-23 NOTE — Progress Notes (Unsigned)
  Cardiology Office Note:   Date:  11/24/2022  ID:  Atha, Mcbain 08/03/1965, MRN [id number] PCP: Merlene Laughter, MD (Inactive)  [facility] Providers Cardiologist:  Rollene Rotunda, MD {  History of Present Illness:   Kevin Walsh is a 57 y.o. male who presents for evaluation of chest pain. He was admitted in August 2022. 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 Echo with LVEF 55-60% and akinesis of LV and entire apical segment.   Since I last saw him he has done well.  The patient denies any new symptoms such as chest discomfort, neck or arm discomfort. There has been no new shortness of breath, PND or orthopnea. There have been no reported palpitations, presyncope or syncope.  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  He has none of the symptoms that was his previous event.   ROS: As stated in the HPI and negative for all other systems.  Studies Reviewed:    EKG:   EKG Interpretation Date/Time:  Monday November 24 2022 07:51:25 EDT Ventricular Rate:  69 PR Interval:  126 QRS Duration:  78 QT Interval:  352 QTC Calculation: 377 R Axis:   61  Text Interpretation: Normal sinus rhythm Normal ECG When compared with ECG of 17-Dec-2020 05:23, No significant change since last tracing Confirmed by Rollene Rotunda ([id number]) on 11/24/2022 8:02:25 AM    Risk Assessment/Calculations:            Physical Exam:   VS:  BP 98/72 (BP Location: Left Arm, Patient Position: Sitting, Cuff Size: Normal)   Pulse 69   Ht 5\' 7"  (1.702 m)   Wt 188 lb 9.6 oz (85.5 kg)   SpO2 98%   BMI 29.54 kg/m    Wt Readings from Last 3 Encounters:  11/24/22 188 lb 9.6 oz (85.5 kg)  11/25/21 191 lb 3.2 oz (86.7 kg)  05/30/21 190 lb 3.2 oz (86.3 kg)     GEN: Well nourished, well developed in no acute distress NECK: No JVD; No carotid bruits CARDIAC: RRR, no murmurs, rubs,  gallops RESPIRATORY:  Clear to auscultation without rales, wheezing or rhonchi  ABDOMEN: Soft, non-tender, non-distended EXTREMITIES:  No edema; No deformity   ASSESSMENT AND PLAN:   CAD:  The patient has no new sypmtoms.  No further cardiovascular testing is indicated.  We will continue with aggressive risk reduction and meds as listed.   HTN : Blood pressure is at target.  No change in therapy.   HLD :   LDL was 60 last year and I would like this to be 50 and will draw a lipid and LPa.     Follow up me in one year.   Signed, Rollene Rotunda, MD

## 2022-11-24 ENCOUNTER — Encounter: Payer: Self-pay | Admitting: Cardiology

## 2022-11-24 ENCOUNTER — Ambulatory Visit: Payer: BC Managed Care – PPO | Attending: Cardiology | Admitting: Cardiology

## 2022-11-24 VITALS — BP 98/72 | HR 69 | Ht 67.0 in | Wt 188.6 lb

## 2022-11-24 DIAGNOSIS — I25118 Atherosclerotic heart disease of native coronary artery with other forms of angina pectoris: Secondary | ICD-10-CM | POA: Diagnosis not present

## 2022-11-24 DIAGNOSIS — E785 Hyperlipidemia, unspecified: Secondary | ICD-10-CM

## 2022-11-24 DIAGNOSIS — I10 Essential (primary) hypertension: Secondary | ICD-10-CM | POA: Diagnosis not present

## 2022-11-24 NOTE — Patient Instructions (Signed)
Medication Instructions:  Your physician recommends that you continue on your current medications as directed. Please refer to the Current Medication list given to you today.  *If you need a refill on your cardiac medications before your next appointment, please call your pharmacy*   Lab Work: Your physician recommends that you have labs drawn today: Lipids & LP(a)  If you have labs (blood work) drawn today and your tests are completely normal, you will receive your results only by: MyChart Message (if you have MyChart) OR A paper copy in the mail If you have any lab test that is abnormal or we need to change your treatment, we will call you to review the results.    Follow-Up: At [facility], you and your health needs are our priority.  As part of our continuing mission to provide you with exceptional heart care, we have created designated Provider Care Teams.  These Care Teams include your primary Cardiologist (physician) and Advanced Practice Providers (APPs -  Physician Assistants and Nurse Practitioners) who all work together to provide you with the care you need, when you need it.  We recommend signing up for the patient portal called "MyChart".  Sign up information is provided on this After Visit Summary.  MyChart is used to connect with patients for Virtual Visits (Telemedicine).  Patients are able to view lab/test results, encounter notes, upcoming appointments, etc.  Non-urgent messages can be sent to your provider as well.   To learn more about what you can do with MyChart, go to [email or web address].    Your next appointment:   12 month(s)  Provider:   Rollene Rotunda, MD

## 2022-11-26 ENCOUNTER — Other Ambulatory Visit: Payer: Self-pay | Admitting: Cardiology

## 2022-11-27 ENCOUNTER — Telehealth: Payer: Self-pay | Admitting: Cardiology

## 2022-11-27 MED ORDER — METOPROLOL SUCCINATE ER 50 MG PO TB24: 50.0000 mg | ORAL_TABLET | Freq: Every day | ORAL | 3 refills | Status: DC

## 2022-11-27 MED ORDER — ATORVASTATIN CALCIUM 80 MG PO TABS: 80.0000 mg | ORAL_TABLET | Freq: Every day | ORAL | 3 refills | Status: DC

## 2022-11-27 NOTE — Telephone Encounter (Signed)
*  STAT* If patient is at the pharmacy, call can be transferred to refill team.   1. Which medications need to be refilled? (please list name of each medication and dose if known)   metoprolol succinate (TOPROL-XL) 50 MG 24 hr tablet  atorvastatin (LIPITOR) 80 MG tablet   2. Which pharmacy/location (including street and city if local pharmacy) is medication to be sent to?[id number] - [place],  - [address] [facility] [address]   3. Do they need a 30 day or 90 day supply? 90 Day Supply

## 2022-11-27 NOTE — Telephone Encounter (Signed)
Pt's medications were sent to pt's pharmacy as requested. Confirmation received.  

## 2022-11-28 ENCOUNTER — Encounter: Payer: Self-pay | Admitting: *Deleted

## 2022-12-02 DIAGNOSIS — Z79899 Other long term (current) drug therapy: Secondary | ICD-10-CM | POA: Diagnosis not present

## 2022-12-02 DIAGNOSIS — E230 Hypopituitarism: Secondary | ICD-10-CM | POA: Diagnosis not present

## 2022-12-09 ENCOUNTER — Telehealth: Payer: Self-pay | Admitting: Cardiology

## 2022-12-09 ENCOUNTER — Encounter: Payer: Self-pay | Admitting: Cardiology

## 2022-12-09 NOTE — Telephone Encounter (Signed)
Pt c/o medication issue:  1. Name of Medication: Metoprolol  2. How are you currently taking this medication (dosage and times per day)? 1 time a day  3. Are you having a reaction (difficulty breathing--STAT)?   4. What is your medication issue? Blood pressure is still running low- patient said Dr Antoine Poche said if his blood pressure kept running low, he might need to stop the Metoprolol  he only remembered this blood pressure reading  -92/70

## 2022-12-09 NOTE — Telephone Encounter (Signed)
Patient states he would like to stop metoprolol. Yesterday BP was 92/70. He does not have any more readings. He only took BP yesterday because when he bent over he got dizzy. Says you mentioned that he may need stop the metoprolol but he did not want to just discontinue without speaking to you first.

## 2022-12-11 NOTE — Telephone Encounter (Signed)
Left voicemail to return call to office.

## 2022-12-12 NOTE — Addendum Note (Signed)
Addended by: Freddi Starr on: 12/12/2022 08:16 AM   Modules accepted: Orders

## 2022-12-12 NOTE — Telephone Encounter (Signed)
My chart message sent to patient to stop metoprolol.

## 2022-12-13 IMAGING — DX DG CHEST 1V PORT
1 series · 1 of 1 positions shown · non-contrast
Comparison: March 26, 2018

CLINICAL DATA: STEMI.  Chest pain.

EXAM:
PORTABLE CHEST 1 VIEW

[chest ap]
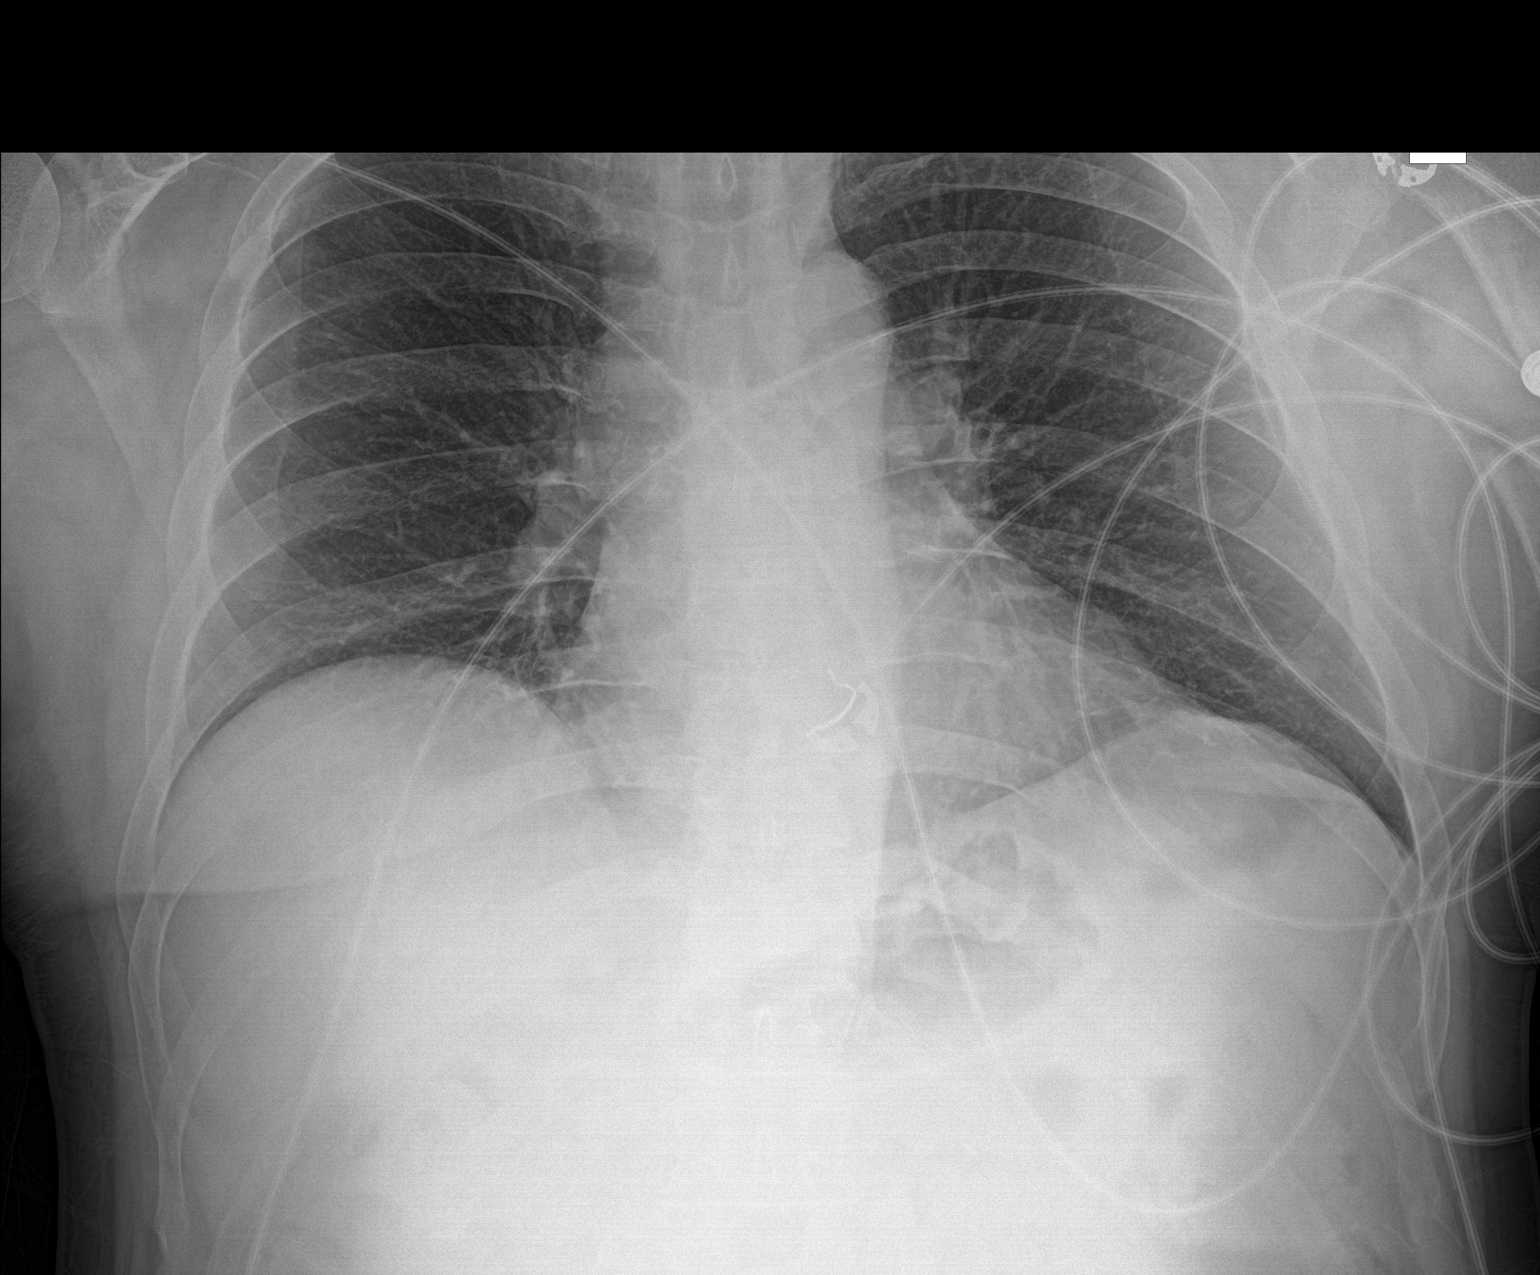

[1 of 1 positions shown; findings below may reference images not displayed]

FINDINGS: The heart size and mediastinal contours are within normal limits.
Both lungs are clear. The visualized skeletal structures are
unremarkable.
IMPRESSION: No active disease.

## 2022-12-17 DIAGNOSIS — M79675 Pain in left toe(s): Secondary | ICD-10-CM | POA: Diagnosis not present

## 2022-12-17 DIAGNOSIS — Z79899 Other long term (current) drug therapy: Secondary | ICD-10-CM | POA: Diagnosis not present

## 2022-12-17 DIAGNOSIS — Z7952 Long term (current) use of systemic steroids: Secondary | ICD-10-CM | POA: Diagnosis not present

## 2022-12-17 DIAGNOSIS — L4050 Arthropathic psoriasis, unspecified: Secondary | ICD-10-CM | POA: Diagnosis not present

## 2022-12-17 DIAGNOSIS — M79642 Pain in left hand: Secondary | ICD-10-CM | POA: Diagnosis not present

## 2022-12-24 DIAGNOSIS — N529 Male erectile dysfunction, unspecified: Secondary | ICD-10-CM | POA: Diagnosis not present

## 2023-03-10 DIAGNOSIS — D2271 Melanocytic nevi of right lower limb, including hip: Secondary | ICD-10-CM | POA: Diagnosis not present

## 2023-03-10 DIAGNOSIS — L821 Other seborrheic keratosis: Secondary | ICD-10-CM | POA: Diagnosis not present

## 2023-03-10 DIAGNOSIS — D2272 Melanocytic nevi of left lower limb, including hip: Secondary | ICD-10-CM | POA: Diagnosis not present

## 2023-03-27 DIAGNOSIS — M65871 Other synovitis and tenosynovitis, right ankle and foot: Secondary | ICD-10-CM | POA: Diagnosis not present

## 2023-03-27 DIAGNOSIS — M19071 Primary osteoarthritis, right ankle and foot: Secondary | ICD-10-CM | POA: Diagnosis not present

## 2023-04-27 DIAGNOSIS — L4050 Arthropathic psoriasis, unspecified: Secondary | ICD-10-CM | POA: Diagnosis not present

## 2023-04-27 DIAGNOSIS — Z79899 Other long term (current) drug therapy: Secondary | ICD-10-CM | POA: Diagnosis not present

## 2023-04-27 DIAGNOSIS — L089 Local infection of the skin and subcutaneous tissue, unspecified: Secondary | ICD-10-CM | POA: Diagnosis not present

## 2023-04-27 DIAGNOSIS — Z7952 Long term (current) use of systemic steroids: Secondary | ICD-10-CM | POA: Diagnosis not present

## 2023-04-27 DIAGNOSIS — M79674 Pain in right toe(s): Secondary | ICD-10-CM | POA: Diagnosis not present

## 2023-05-18 DIAGNOSIS — R7303 Prediabetes: Secondary | ICD-10-CM | POA: Diagnosis not present

## 2023-05-18 DIAGNOSIS — E230 Hypopituitarism: Secondary | ICD-10-CM | POA: Diagnosis not present

## 2023-06-15 DIAGNOSIS — E230 Hypopituitarism: Secondary | ICD-10-CM | POA: Diagnosis not present

## 2023-07-01 DIAGNOSIS — Z125 Encounter for screening for malignant neoplasm of prostate: Secondary | ICD-10-CM | POA: Diagnosis not present

## 2023-07-01 DIAGNOSIS — R7303 Prediabetes: Secondary | ICD-10-CM | POA: Diagnosis not present

## 2023-07-01 DIAGNOSIS — Z0000 Encounter for general adult medical examination without abnormal findings: Secondary | ICD-10-CM | POA: Diagnosis not present

## 2023-07-06 ENCOUNTER — Encounter: Payer: Self-pay | Admitting: Cardiology

## 2023-07-10 DIAGNOSIS — L4050 Arthropathic psoriasis, unspecified: Secondary | ICD-10-CM | POA: Diagnosis not present

## 2023-07-10 DIAGNOSIS — Z79899 Other long term (current) drug therapy: Secondary | ICD-10-CM | POA: Diagnosis not present

## 2023-07-10 DIAGNOSIS — Z7952 Long term (current) use of systemic steroids: Secondary | ICD-10-CM | POA: Diagnosis not present

## 2023-07-10 DIAGNOSIS — L089 Local infection of the skin and subcutaneous tissue, unspecified: Secondary | ICD-10-CM | POA: Diagnosis not present

## 2023-09-03 DIAGNOSIS — L608 Other nail disorders: Secondary | ICD-10-CM | POA: Diagnosis not present

## 2023-09-03 DIAGNOSIS — R946 Abnormal results of thyroid function studies: Secondary | ICD-10-CM | POA: Diagnosis not present

## 2023-09-03 DIAGNOSIS — I10 Essential (primary) hypertension: Secondary | ICD-10-CM | POA: Diagnosis not present

## 2023-09-23 ENCOUNTER — Encounter: Payer: Self-pay | Admitting: Cardiology

## 2023-09-23 DIAGNOSIS — L089 Local infection of the skin and subcutaneous tissue, unspecified: Secondary | ICD-10-CM | POA: Diagnosis not present

## 2023-09-23 DIAGNOSIS — L4050 Arthropathic psoriasis, unspecified: Secondary | ICD-10-CM | POA: Diagnosis not present

## 2023-09-23 DIAGNOSIS — Z7952 Long term (current) use of systemic steroids: Secondary | ICD-10-CM | POA: Diagnosis not present

## 2023-09-23 DIAGNOSIS — Z79899 Other long term (current) drug therapy: Secondary | ICD-10-CM | POA: Diagnosis not present

## 2023-10-22 DIAGNOSIS — L409 Psoriasis, unspecified: Secondary | ICD-10-CM | POA: Diagnosis not present

## 2023-10-22 DIAGNOSIS — M7989 Other specified soft tissue disorders: Secondary | ICD-10-CM | POA: Diagnosis not present

## 2023-10-22 DIAGNOSIS — Z79899 Other long term (current) drug therapy: Secondary | ICD-10-CM | POA: Diagnosis not present

## 2023-10-22 DIAGNOSIS — L4050 Arthropathic psoriasis, unspecified: Secondary | ICD-10-CM | POA: Diagnosis not present

## 2023-10-22 DIAGNOSIS — M79675 Pain in left toe(s): Secondary | ICD-10-CM | POA: Diagnosis not present

## 2023-10-29 DIAGNOSIS — L821 Other seborrheic keratosis: Secondary | ICD-10-CM | POA: Diagnosis not present

## 2023-10-29 DIAGNOSIS — D485 Neoplasm of uncertain behavior of skin: Secondary | ICD-10-CM | POA: Diagnosis not present

## 2023-10-29 DIAGNOSIS — L570 Actinic keratosis: Secondary | ICD-10-CM | POA: Diagnosis not present

## 2023-10-29 DIAGNOSIS — D229 Melanocytic nevi, unspecified: Secondary | ICD-10-CM | POA: Diagnosis not present

## 2023-10-29 DIAGNOSIS — L578 Other skin changes due to chronic exposure to nonionizing radiation: Secondary | ICD-10-CM | POA: Diagnosis not present

## 2023-10-29 DIAGNOSIS — C44719 Basal cell carcinoma of skin of left lower limb, including hip: Secondary | ICD-10-CM | POA: Diagnosis not present

## 2023-10-29 DIAGNOSIS — L814 Other melanin hyperpigmentation: Secondary | ICD-10-CM | POA: Diagnosis not present

## 2023-11-11 DIAGNOSIS — C44719 Basal cell carcinoma of skin of left lower limb, including hip: Secondary | ICD-10-CM | POA: Diagnosis not present

## 2023-11-23 NOTE — Progress Notes (Unsigned)
  Cardiology Office Note:   Date:  11/26/2023  ID:  Kevin, Walsh 1965/05/24, MRN [id number] PCP: Ransom Other, MD  [facility] Providers Cardiologist:  Lynwood Schilling, MD {  History of Present Illness:   Kevin Walsh is a 58 y.o. male who presents for evaluation of chest pain. He was admitted in August 2022. 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 Echo with LVEF 55-60% and akinesis of LV and entire apical segment.    Since I last saw him he has done well.  He says he gets a lot of exercise during the golf club.  He denies any cardiovascular symptoms. The patient denies any new symptoms such as chest discomfort, neck or arm discomfort. There has been no new shortness of breath, PND or orthopnea. There have been no reported palpitations, presyncope or syncope.   ROS: As stated in the HPI and negative for all other systems.  Studies Reviewed:    EKG:   EKG Interpretation Date/Time:  Thursday November 26 2023 08:14:27 EDT Ventricular Rate:  86 PR Interval:  112 QRS Duration:  72 QT Interval:  320 QTC Calculation: 382 R Axis:   57  Text Interpretation: Normal sinus rhythm Normal ECG When compared with ECG of 24-Nov-2022 07:51, No significant change was found Confirmed by Schilling Lynwood ([id number]) on 11/26/2023 8:15:54 AM    Risk Assessment/Calculations:         Physical Exam:   VS:  BP 102/70 (BP Location: Left Arm, Patient Position: Sitting, Cuff Size: Normal)   Pulse 86   Ht 5' 7 (1.702 m)   Wt 176 lb 12.8 oz (80.2 kg)   SpO2 96%   BMI 27.69 kg/m    Wt Readings from Last 3 Encounters:  11/26/23 176 lb 12.8 oz (80.2 kg)  11/24/22 188 lb 9.6 oz (85.5 kg)  11/25/21 191 lb 3.2 oz (86.7 kg)     GEN: Well nourished, well developed in no acute distress NECK: No JVD; No carotid bruits CARDIAC: RRR, no murmurs, rubs,  gallops RESPIRATORY:  Clear to auscultation without rales, wheezing or rhonchi  ABDOMEN: Soft, non-tender, non-distended EXTREMITIES:  No edema; No deformity   ASSESSMENT AND PLAN:   CAD:  The patient has no new sypmtoms.  No further cardiovascular testing is indicated.  We will continue with aggressive risk reduction and meds as listed.  HTN : Blood pressure is at target.  No change in therapy  HLD :   LDL was 53 with an HDL of 33 earlier this year.  His LPA is slightly elevated.  He will remain on the meds as listed.     Follow up with me in 18 months.  Signed, Lynwood Schilling, MD

## 2023-11-26 ENCOUNTER — Encounter: Payer: Self-pay | Admitting: Cardiology

## 2023-11-26 ENCOUNTER — Ambulatory Visit: Attending: Cardiology | Admitting: Cardiology

## 2023-11-26 VITALS — BP 102/70 | HR 86 | Ht 67.0 in | Wt 176.8 lb

## 2023-11-26 DIAGNOSIS — I10 Essential (primary) hypertension: Secondary | ICD-10-CM

## 2023-11-26 DIAGNOSIS — I25118 Atherosclerotic heart disease of native coronary artery with other forms of angina pectoris: Secondary | ICD-10-CM | POA: Diagnosis not present

## 2023-11-26 DIAGNOSIS — E785 Hyperlipidemia, unspecified: Secondary | ICD-10-CM | POA: Diagnosis not present

## 2023-11-26 NOTE — Patient Instructions (Signed)
 Medication Instructions:  Your physician recommends that you continue on your current medications as directed. Please refer to the Current Medication list given to you today.  *If you need a refill on your cardiac medications before your next appointment, please call your pharmacy*  Lab Work: NONE If you have labs (blood work) drawn today and your tests are completely normal, you will receive your results only by: MyChart Message (if you have MyChart) OR A paper copy in the mail If you have any lab test that is abnormal or we need to change your treatment, we will call you to review the results.  Testing/Procedures: NONE  Follow-Up: At [facility], you and your health needs are our priority.  As part of our continuing mission to provide you with exceptional heart care, our providers are all part of one team.  This team includes your primary Cardiologist (physician) and Advanced Practice Providers or APPs (Physician Assistants and Nurse Practitioners) who all work together to provide you with the care you need, when you need it.  Your next appointment:   18 months  Provider:   [place], MD  We recommend signing up for the patient portal called MyChart.  Sign up information is provided on this After Visit Summary.  MyChart is used to connect with patients for Virtual Visits (Telemedicine).  Patients are able to view lab/test results, encounter notes, upcoming appointments, etc.  Non-urgent messages can be sent to your provider as well.   To learn more about what you can do with MyChart, go to [email or web address].

## 2023-12-03 DIAGNOSIS — Z79899 Other long term (current) drug therapy: Secondary | ICD-10-CM | POA: Diagnosis not present

## 2023-12-21 ENCOUNTER — Other Ambulatory Visit: Payer: Self-pay | Admitting: Cardiology

## 2023-12-24 DIAGNOSIS — M5416 Radiculopathy, lumbar region: Secondary | ICD-10-CM | POA: Diagnosis not present

## 2024-01-25 DIAGNOSIS — L409 Psoriasis, unspecified: Secondary | ICD-10-CM | POA: Diagnosis not present

## 2024-01-25 DIAGNOSIS — L4050 Arthropathic psoriasis, unspecified: Secondary | ICD-10-CM | POA: Diagnosis not present

## 2024-01-25 DIAGNOSIS — Z79899 Other long term (current) drug therapy: Secondary | ICD-10-CM | POA: Diagnosis not present

## 2024-01-25 DIAGNOSIS — M79676 Pain in unspecified toe(s): Secondary | ICD-10-CM | POA: Diagnosis not present

## 2024-02-05 DIAGNOSIS — Z79899 Other long term (current) drug therapy: Secondary | ICD-10-CM | POA: Diagnosis not present

## 2024-02-09 DIAGNOSIS — M545 Low back pain, unspecified: Secondary | ICD-10-CM | POA: Diagnosis not present

## 2024-03-24 DIAGNOSIS — Z125 Encounter for screening for malignant neoplasm of prostate: Secondary | ICD-10-CM | POA: Diagnosis not present

## 2024-03-24 DIAGNOSIS — L299 Pruritus, unspecified: Secondary | ICD-10-CM | POA: Diagnosis not present

## 2024-03-24 DIAGNOSIS — E230 Hypopituitarism: Secondary | ICD-10-CM | POA: Diagnosis not present

## 2024-04-18 DIAGNOSIS — L299 Pruritus, unspecified: Secondary | ICD-10-CM | POA: Diagnosis not present

## 2024-04-18 DIAGNOSIS — E230 Hypopituitarism: Secondary | ICD-10-CM | POA: Diagnosis not present

## 2024-04-18 DIAGNOSIS — Z125 Encounter for screening for malignant neoplasm of prostate: Secondary | ICD-10-CM | POA: Diagnosis not present
